# Patient Record
Sex: Male | Born: 1940 | Race: White | Hispanic: No | Marital: Married | Smoking: Current every day smoker
Health system: Southern US, Community
[De-identification: ages and names within clinical notes are randomized; demographics above are authoritative.]

## PROBLEM LIST (undated history)

## (undated) DIAGNOSIS — R17 Unspecified jaundice: Secondary | ICD-10-CM

## (undated) DIAGNOSIS — F419 Anxiety disorder, unspecified: Secondary | ICD-10-CM

## (undated) DIAGNOSIS — H269 Unspecified cataract: Secondary | ICD-10-CM

## (undated) DIAGNOSIS — H409 Unspecified glaucoma: Secondary | ICD-10-CM

## (undated) DIAGNOSIS — C250 Malignant neoplasm of head of pancreas: Principal | ICD-10-CM

## (undated) DIAGNOSIS — D490 Neoplasm of unspecified behavior of digestive system: Secondary | ICD-10-CM

## (undated) HISTORY — DX: Unspecified glaucoma: H40.9

## (undated) HISTORY — DX: Malignant neoplasm of head of pancreas: C25.0

## (undated) HISTORY — PX: CATARACT EXTRACTION: SUR2

## (undated) HISTORY — DX: Unspecified cataract: H26.9

## (undated) SURGERY — UPPER ENDOSCOPIC ULTRASOUND (EUS) LINEAR
Anesthesia: Monitor Anesthesia Care

---

## 2015-09-25 ENCOUNTER — Ambulatory Visit (INDEPENDENT_AMBULATORY_CARE_PROVIDER_SITE_OTHER): Payer: Medicare Other | Admitting: Family Medicine

## 2015-09-25 ENCOUNTER — Emergency Department (HOSPITAL_COMMUNITY)
Admission: EM | Admit: 2015-09-25 | Discharge: 2015-09-26 | Disposition: A | Discharge status: Home / Self Care | Payer: Medicare Other | Attending: Emergency Medicine | Admitting: Emergency Medicine

## 2015-09-25 ENCOUNTER — Emergency Department (HOSPITAL_COMMUNITY): Payer: Medicare Other

## 2015-09-25 ENCOUNTER — Encounter (HOSPITAL_COMMUNITY): Payer: Self-pay

## 2015-09-25 VITALS — BP 144/80 | HR 100 | Temp 98.9°F | Resp 16 | Ht 69.0 in | Wt 129.0 lb

## 2015-09-25 DIAGNOSIS — F17210 Nicotine dependence, cigarettes, uncomplicated: Secondary | ICD-10-CM | POA: Insufficient documentation

## 2015-09-25 DIAGNOSIS — R109 Unspecified abdominal pain: Secondary | ICD-10-CM | POA: Diagnosis not present

## 2015-09-25 DIAGNOSIS — R17 Unspecified jaundice: Secondary | ICD-10-CM | POA: Diagnosis not present

## 2015-09-25 DIAGNOSIS — R634 Abnormal weight loss: Secondary | ICD-10-CM | POA: Diagnosis not present

## 2015-09-25 DIAGNOSIS — D490 Neoplasm of unspecified behavior of digestive system: Secondary | ICD-10-CM

## 2015-09-25 DIAGNOSIS — H409 Unspecified glaucoma: Secondary | ICD-10-CM | POA: Insufficient documentation

## 2015-09-25 DIAGNOSIS — D136 Benign neoplasm of pancreas: Secondary | ICD-10-CM | POA: Diagnosis not present

## 2015-09-25 DIAGNOSIS — Z79899 Other long term (current) drug therapy: Secondary | ICD-10-CM | POA: Diagnosis not present

## 2015-09-25 LAB — POCT CBC
Granulocyte percent: 76.1 %G (ref 37–80)
HCT, POC: 36.7 % — AB (ref 43.5–53.7)
HEMOGLOBIN: 12.7 g/dL — AB (ref 14.1–18.1)
LYMPH, POC: 1.7 (ref 0.6–3.4)
MCH, POC: 29.8 pg (ref 27–31.2)
MCHC: 34.6 g/dL (ref 31.8–35.4)
MCV: 86.1 fL (ref 80–97)
MID (cbc): 0.6 (ref 0–0.9)
MPV: 8.8 fL (ref 0–99.8)
POC Granulocyte: 4.7 (ref 2–6.9)
POC LYMPH PERCENT: 17.7 %L (ref 10–50)
POC MID %: 6.2 %M (ref 0–12)
Platelet Count, POC: 157 10*3/uL (ref 142–424)
RBC: 4.26 M/uL — AB (ref 4.69–6.13)
RDW, POC: 17.9 %
WBC: 9.7 10*3/uL (ref 4.6–10.2)

## 2015-09-25 LAB — URINALYSIS, ROUTINE W REFLEX MICROSCOPIC
HGB URINE DIPSTICK: NEGATIVE
Ketones, ur: NEGATIVE mg/dL
Nitrite: POSITIVE — AB
PH: 5.5 (ref 5.0–8.0)
Protein, ur: 30 mg/dL — AB
Specific Gravity, Urine: 1.041 — ABNORMAL HIGH (ref 1.005–1.030)

## 2015-09-25 LAB — COMPLETE METABOLIC PANEL WITH GFR
ALBUMIN: 3.6 g/dL (ref 3.6–5.1)
ALT: 131 U/L — AB (ref 9–46)
AST: 76 U/L — ABNORMAL HIGH (ref 10–35)
Alkaline Phosphatase: 279 U/L — ABNORMAL HIGH (ref 40–115)
BILIRUBIN TOTAL: 14.5 mg/dL — AB (ref 0.2–1.2)
BUN: 16 mg/dL (ref 7–25)
CO2: 26 mmol/L (ref 20–31)
CREATININE: 1.07 mg/dL (ref 0.70–1.18)
Calcium: 9.5 mg/dL (ref 8.6–10.3)
Chloride: 96 mmol/L — ABNORMAL LOW (ref 98–110)
GFR, EST AFRICAN AMERICAN: 78 mL/min (ref >=60)
GFR, Est Non African American: 68 mL/min (ref >=60)
GLUCOSE: 390 mg/dL — AB (ref 65–99)
Potassium: 3.4 mmol/L — ABNORMAL LOW (ref 3.5–5.3)
SODIUM: 131 mmol/L — AB (ref 135–146)
TOTAL PROTEIN: 6 g/dL — AB (ref 6.1–8.1)

## 2015-09-25 LAB — POCT URINALYSIS DIP (MANUAL ENTRY)
Glucose, UA: >=1000 — AB
LEUKOCYTES UA: NEGATIVE
NITRITE UA: NEGATIVE
PH UA: 5.5
RBC UA: NEGATIVE
Spec Grav, UA: 1.01
Urobilinogen, UA: 1

## 2015-09-25 LAB — PROTIME-INR
INR: 1.11 (ref 0.00–1.49)
PROTHROMBIN TIME: 14 s (ref 11.6–15.2)

## 2015-09-25 LAB — TSH: TSH: 2.41 mIU/L (ref 0.40–4.50)

## 2015-09-25 LAB — CBC WITH DIFFERENTIAL/PLATELET
Basophils Absolute: 0 10*3/uL (ref 0.0–0.1)
Basophils Relative: 0 %
EOS PCT: 2 %
Eosinophils Absolute: 0.2 10*3/uL (ref 0.0–0.7)
HEMATOCRIT: 34 % — AB (ref 39.0–52.0)
Hemoglobin: 11.9 g/dL — ABNORMAL LOW (ref 13.0–17.0)
LYMPHS ABS: 1.5 10*3/uL (ref 0.7–4.0)
LYMPHS PCT: 17 %
MCH: 29.2 pg (ref 26.0–34.0)
MCHC: 35 g/dL (ref 30.0–36.0)
MCV: 83.3 fL (ref 78.0–100.0)
MONO ABS: 0.5 10*3/uL (ref 0.1–1.0)
Monocytes Relative: 5 %
Neutro Abs: 6.7 10*3/uL (ref 1.7–7.7)
Neutrophils Relative %: 76 %
PLATELETS: 171 10*3/uL (ref 150–400)
RBC: 4.08 MIL/uL — AB (ref 4.22–5.81)
RDW: 17.1 % — ABNORMAL HIGH (ref 11.5–15.5)
WBC: 8.9 10*3/uL (ref 4.0–10.5)

## 2015-09-25 LAB — URINE MICROSCOPIC-ADD ON

## 2015-09-25 LAB — COMPREHENSIVE METABOLIC PANEL
ALK PHOS: 290 U/L — AB (ref 38–126)
ALT: 137 U/L — AB (ref 17–63)
AST: 86 U/L — ABNORMAL HIGH (ref 15–41)
Albumin: 3.4 g/dL — ABNORMAL LOW (ref 3.5–5.0)
Anion gap: 10 (ref 5–15)
BUN: 17 mg/dL (ref 6–20)
CHLORIDE: 96 mmol/L — AB (ref 101–111)
CO2: 25 mmol/L (ref 22–32)
CREATININE: 0.59 mg/dL — AB (ref 0.61–1.24)
Calcium: 9.5 mg/dL (ref 8.9–10.3)
GFR calc Af Amer: >60 mL/min (ref >60)
GFR calc non Af Amer: >60 mL/min (ref >60)
Glucose, Bld: 269 mg/dL — ABNORMAL HIGH (ref 65–99)
Potassium: 2.9 mmol/L — ABNORMAL LOW (ref 3.5–5.1)
Sodium: 131 mmol/L — ABNORMAL LOW (ref 135–145)
Total Bilirubin: 14.1 mg/dL — ABNORMAL HIGH (ref 0.3–1.2)
Total Protein: 6.8 g/dL (ref 6.5–8.1)

## 2015-09-25 LAB — POC MICROSCOPIC URINALYSIS (UMFC): Mucus: ABSENT

## 2015-09-25 LAB — LIPASE, BLOOD: Lipase: 42 U/L (ref 11–51)

## 2015-09-25 LAB — POCT SEDIMENTATION RATE: POCT SED RATE: 55 mm/hr — AB (ref 0–22)

## 2015-09-25 MED ORDER — POTASSIUM CHLORIDE CRYS ER 20 MEQ PO TBCR
40.0000 meq | EXTENDED_RELEASE_TABLET | Freq: Once | ORAL | Status: AC
  Administered 2015-09-25: 40 meq via ORAL
  Filled 2015-09-25: qty 2

## 2015-09-25 MED ORDER — TRAMADOL HCL 50 MG PO TABS: 50.0000 mg | ORAL_TABLET | Freq: Four times a day (QID) | ORAL | Status: DC | PRN

## 2015-09-25 MED ORDER — IOHEXOL 300 MG/ML  SOLN
25.0000 mL | Freq: Once | INTRAMUSCULAR | Status: AC | PRN
Start: 2015-09-25 — End: 2015-09-25
  Administered 2015-09-25: 25 mL via ORAL

## 2015-09-25 MED ORDER — IOHEXOL 300 MG/ML  SOLN
100.0000 mL | Freq: Once | INTRAMUSCULAR | Status: AC | PRN
Start: 2015-09-25 — End: 2015-09-25
  Administered 2015-09-25: 100 mL via INTRAVENOUS

## 2015-09-25 NOTE — ED Notes (Addendum)
Pt, being sent by Urgent Care, c/o abdominal pain x 1-2 months, weight loss x 3 weeks, jaundice x 2 weeks, and dark urine x 10 days.  Denies pain.  Pt had blood work performed at [facility] and directed to come to the ED for a CT scan.  Denies liver problems and confusion.

## 2015-09-25 NOTE — ED Provider Notes (Signed)
CSN: [id number]     Arrival date & time 09/25/15  1759 History   First MD Initiated Contact with Patient 09/25/15 1910     Chief Complaint  Patient presents with  . Jaundice  . Weight Loss  . Abdominal Pain     (Consider location/radiation/quality/duration/timing/severity/associated sxs/prior Treatment) HPI Roger Vaughn is a 75 y.o. male who comes in for evaluation of abdominal pain, weight loss and jaundice. Patient reports over the past 1.5 months had intermittent abdominal pain that he reports is waxing and waning, sometimes goes days without any discomfort, but sometimes has intense pain that he rates as a 4/10. He reports associated 13 pound weight loss over this time. He does not have a PCP. Went to an urgent care facility today for evaluation and they referred him to the emergency department for a CT scan. He is a 1.5 pack per day smoker for the past 50 years. Denies any fevers, chills, nausea or vomiting, abdominal distention, diarrhea or constipation, urinary symptoms. No other modifying factors or medical complaints.  Past Medical History  Diagnosis Date  . Cataract   . Glaucoma    Past Surgical History  Procedure Laterality Date  . Cataract extraction     Family History  Problem Relation Age of Onset  . Cancer Father   . Cancer Sister   . Cancer Sister    Social History  Substance Use Topics  . Smoking status: Current Every Day Smoker -- 1.50 packs/day for 50 years    Types: Cigarettes  . Smokeless tobacco: Never Used  . Alcohol Use: No    Review of Systems A 10 point review of systems was completed and was negative except for pertinent positives and negatives as mentioned in the history of present illness     Allergies  Review of patient's allergies indicates no known allergies.  Home Medications   Prior to Admission medications   Medication Sig Start Date End Date Taking? Authorizing Provider  ibuprofen (ADVIL,MOTRIN) 200 MG tablet Take 400 mg by mouth  every 6 (six) hours as needed for moderate pain.   Yes Historical Provider, MD  latanoprost (XALATAN) 0.005 % ophthalmic solution 1 drop at bedtime.   Yes Historical Provider, MD  traMADol (ULTRAM) 50 MG tablet Take 1 tablet (50 mg total) by mouth every 6 (six) hours as needed. 09/25/15   Comer Locket, PA-C   BP 158/77 mmHg  Pulse 62  Temp(Src) 97.6 F (36.4 C) (Oral)  Resp 18  SpO2 98% Physical Exam  Constitutional: He is oriented to person, place, and time. He appears well-developed and well-nourished.  HENT:  Head: Normocephalic and atraumatic.  Mouth/Throat: Oropharynx is clear and moist.  Eyes: Conjunctivae are normal. Pupils are equal, round, and reactive to light. Right eye exhibits no discharge. Left eye exhibits no discharge. Scleral icterus is present.  Neck: Neck supple.  Cardiovascular: Normal rate, regular rhythm and normal heart sounds.   Pulmonary/Chest: Effort normal and breath sounds normal. No respiratory distress. He has no wheezes. He has no rales.  Abdominal: Soft. There is no tenderness.  Abdomen is somewhat firm, but not rigid or distended. No tenderness. No other deformities noted.  Musculoskeletal: He exhibits no tenderness.  Neurological: He is alert and oriented to person, place, and time.  Cranial Nerves II-XII grossly intact  Skin: Skin is warm and dry. No rash noted.  jaundiced  Psychiatric: He has a normal mood and affect.  Nursing note and vitals reviewed.   ED Course  Procedures (  including critical care time) Labs Review Labs Reviewed  COMPREHENSIVE METABOLIC PANEL - Abnormal; Notable for the following:    Sodium 131 (*)    Potassium 2.9 (*)    Chloride 96 (*)    Glucose, Bld 269 (*)    Creatinine, Ser 0.59 (*)    Albumin 3.4 (*)    AST 86 (*)    ALT 137 (*)    Alkaline Phosphatase 290 (*)    Total Bilirubin 14.1 (*)    All other components within normal limits  CBC WITH DIFFERENTIAL/PLATELET - Abnormal; Notable for the following:     RBC 4.08 (*)    Hemoglobin 11.9 (*)    HCT 34.0 (*)    RDW 17.1 (*)    All other components within normal limits  URINALYSIS, ROUTINE W REFLEX MICROSCOPIC (NOT AT [facility]) - Abnormal; Notable for the following:    Color, Urine ORANGE (*)    APPearance CLOUDY (*)    Specific Gravity, Urine 1.041 (*)    Glucose, UA >1000 (*)    Bilirubin Urine LARGE (*)    Protein, ur 30 (*)    Nitrite POSITIVE (*)    Leukocytes, UA SMALL (*)    All other components within normal limits  URINE MICROSCOPIC-ADD ON - Abnormal; Notable for the following:    Squamous Epithelial / LPF 0-5 (*)    Bacteria, UA FEW (*)    Casts HYALINE CASTS (*)    All other components within normal limits  LIPASE, BLOOD  PROTIME-INR    Imaging Review Ct Abdomen Pelvis W Contrast  09/25/2015  CLINICAL DATA:  75 year old male with abdominal pain and jaundice patient presenting with weight loss. EXAM: CT ABDOMEN AND PELVIS WITH CONTRAST TECHNIQUE: Multidetector CT imaging of the abdomen and pelvis was performed using the standard protocol following bolus administration of intravenous contrast. CONTRAST:  31mL OMNIPAQUE IOHEXOL 300 MG/ML SOLN, 117mL OMNIPAQUE IOHEXOL 300 MG/ML SOLN COMPARISON:  None. 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 No intra-abdominal free air or free fluid. Multiple small hepatic hypodense lesions measuring up to 9 mm in the right lobe of the liver are not well characterized. There is mild intrahepatic biliary ductal dilatation. There is dilatation of the common hepatic and common bile duct. The common bile duct measures up to 2 cm in diameter. The gallbladder is distended. No calcified gallstone identified. There is a 1.9 x 2.5 cm ill-defined hypoenhancing mass in the head of the pancreas. The 1.2 x 1.4 cm hypodense/hypo enhancing nodular density noted more posterior to the main mass in the uncinate process of pancreas which may represent extension of the pancreatic mass or a satellite lesion.  There is atrophy of the body and tail of the pancreas with dilatation of the main pancreatic duct. The pancreatic mass appears to abut the central SMV with moderate compression of the vessel. The spleen, adrenal glands appear unremarkable. 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 Multiple small left renal hypodense lesions are not well characterized but possibly represent cysts. A 1.8 cm exophytic hypodense lesion from the posterior lateral cortex of the left kidney also likely represents a cyst. Ultrasound may provide better evaluation of the kidneys. There is no hydronephrosis on either side. The visualized ureters and urinary bladder appear unremarkable. The prostate and seminal vesicles are grossly unremarkable. There is extensive sigmoid diverticulosis with muscular hypertrophy. No active inflammatory changes. There is no evidence of bowel obstruction or  inflammation. Normal appendix. Advanced aortoiliac atherosclerotic disease. The aorta is ectatic and measures up to 2.5 cm in diameter. The origins of the celiac axis, SMA, IMA as well as the origins of the renal arteries appear patent. No portal venous gas identified. There is no adenopathy. The abdominal wall soft tissues appear unremarkable. There is osteopenia with degenerative changes of the spine. No acute fracture. 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 Further evaluation with MRI without and with contrast or EUS recommended. Diffuse biliary ductal dilatation secondary to obstruction of the central CBD at the head of the pancreas. Multiple small hepatic hypodense lesions, incompletely characterized. MRI without and with contrast is recommended for further characterization. Sigmoid diverticulosis. No evidence of bowel obstruction or inflammation. Electronically Signed   By: Anner Crete M.D.   On: 09/25/2015 22:52   I have personally reviewed and evaluated these images and lab results as part of my medical decision-making.   EKG Interpretation None     Meds given in ED:  Medications  potassium chloride SA (K-DUR,KLOR-CON) CR tablet 40 mEq (40 mEq Oral Given 09/25/15 2217)  iohexol (OMNIPAQUE) 300 MG/ML solution 25 mL (25 mLs Oral Contrast Given 09/25/15 2122)  iohexol (OMNIPAQUE) 300 MG/ML solution 100 mL (100 mLs Intravenous Contrast Given 09/25/15 2226)    New Prescriptions   TRAMADOL (ULTRAM) 50 MG TABLET    Take 1 tablet (50 mg total) by mouth every 6 (six) hours as needed.   Filed Vitals:   09/25/15 1825 09/25/15 1936 09/25/15 2113 09/26/15 0011  BP: 155/106 138/73 161/89 158/77  Pulse: 65 49 54 62  Temp: 98.5 F (36.9 C) 97.6 F (36.4 C)    TempSrc: Oral Oral    Resp: 16 16 16 18   SpO2: 100% 100% 97% 98%    MDM  Roger Vaughn is a 75 y.o. male comes in for evaluation of abdominal discomfort and jaundice over the past 1.5 months. Seen at urgent care earlier today and referred to ED for imaging. On arrival, patient is jaundiced, no abdominal pain and denies any other medical problems or symptoms. Bilirubin 14.1, alkaline phosphatase 290. CT scan of abdomen shows hypoenhancing mass in the head of the pancreas most compatible with malignancy. Diffuse biliary ductal dilatation due to obstruction of central CBD at the head of the pancreas. Discussed ED course as well as results with the patient. Answered all questions patient's had at bedside. At this time, he does not want to be admitted to hospital service. They prefer to go home and follow-up on an outpatient basis. I believe this is a reasonable request. Discussed with oncology, Dr. Alvy Bimler, recommends we contact gastroenterology in order to expedite workup to obtain tissue biopsy. Consult to gastroenterology, Dr. Oletta Lamas, his office will call patient tomorrow in order to establish follow-up. Prior to  patient discharge, I discussed and reviewed this case with Dr.Knapp. Patient verbalizes understanding of discharge instructions and ED course today. Voices no other questions or concerns at this time. Is amenable to discharge.   Final diagnoses:  Jaundice  Pancreatic tumor       Comer Locket, PA-C 09/26/15 [id number]  Dorie Rank, MD 09/26/15 1235

## 2015-09-25 NOTE — Discharge Instructions (Signed)
You were evaluated in the ED today and were found to have a tumor on your pancreas. [facility] will call you tomorrow to schedule an appointment for reevaluation. They will need to obtain a tissue biopsy in order to determine what type of tumor it is. If you have not heard from them in 2 days, call their office to schedule an appointment. You may take your pain medicine as prescribed. Return to ED for any worsening symptoms including fever, vomiting,worsening pain.  Jaundice, Adult Jaundice is a yellowish discoloration of the skin, the whites of the eyes, and mucous membranes. Jaundice can be a sign that the liver or the bile system is not working normally. CAUSES This condition is caused by an increased level of bilirubin in the blood. Bilirubin is a substance that is produced by the normal breakdown of red blood cells. Conditions and activities that can cause an increase in the bilirubin level include:  Viral hepatitis.  Gallstones or other conditions, such as a tumor, that can cause a blockage of bile ducts.  Excessive use of alcohol.  Other liver diseases, such as cirrhosis.  Certain cancers.  Certain infections.  Certain genetic syndromes.  Certain medicines. SYMPTOMS Symptoms of this condition include:  Yellow color to the skin, the whites of the eyes, or mucous membranes.  Dark brown urine.  Stomach pain.  Light or clay-colored stool.  Itchy skin (pruritus). DIAGNOSIS This condition is diagnosed with a medical history, physical exam, and blood tests. You may have additional tests to determine what is causing your bilirubin level to increase. TREATMENT Treatment for jaundice depends on the underlying condition. Treatment may include:  Stopping the use of a certain medicine.  Fluids that are given through an IV tube that is inserted into one of your veins.  Medicines to treat pruritus.  Surgery, if there is blockage of the bile ducts. HOME CARE  INSTRUCTIONS  Drink enough fluid to keep your urine clear or pale yellow.  Do not drink alcohol.  Take medicines only as directed by your health care provider.  Keep all follow-up visits as directed by your health care provider. This is important.  You may use skin lotion to relieve itching. SEEK MEDICAL CARE IF:  You have a fever. SEEK IMMEDIATE MEDICAL CARE IF:  Your symptoms suddenly get worse.  You have symptoms for more than 72 hours.  Your pain gets worse.  You vomit repeatedly.  You become weak or confused.  You develop a severe headache.  You become severely dehydrated. Signs of severe dehydration include:  A very dry mouth.  A rapid, weak pulse.  Rapid breathing.  Blue lips.   This information is not intended to replace advice given to you by your health care provider. Make sure you discuss any questions you have with your health care provider.   Document Released: 08/03/2005 Document Revised: 12/18/2014 Document Reviewed: 07/30/2014 Elsevier Interactive Patient Education [employer].

## 2015-09-25 NOTE — Progress Notes (Signed)
[facility] [address] [place] [address] [phone]  Date:  09/25/2015   Name:  Roger Vaughn   DOB:  [birth year]   MRN:  [id number]  PCP:  Velna Hatchet, MD    History of Present Illness:  Roger Vaughn is a 74 y.o. male smoker who presents to [facility] for cc of abdominal pain, jaundice for 2 weeks.     Wife became concerned over the last 2 weeks when he started having yellowing dry skin.  He acknowledges over 1.5 months of constipation, which has been helped by prune juice.  He has had lower pelvic abdominal pain though none tonight.  Over the last 1.5 months he has had loss of appetite and weight loss of 15lbs, he reports.  He has fatigue.  One episode of emesis that occurred about 1 month ago.  Lately, he has been having some loose stools over the last few weeks.  No blood in the stool or melena.  His stool was grayish white, and has now been brown small stool balls.  He has never had a colonoscopy.  He may have not had any medical care for 25 years.  EtOH: none   Meal consist mainly of consist of chicken, cereal, and sweet tea.    He has an appointment scheduled with Dr. Ardeth Perfect of [facility] in 1 month.  This will be his first visit.  Currently has no PCP.  They insisted that he be seen today, with reports of his jaundice.    There are no active problems to display for this patient.   Past Medical History  Diagnosis Date  . Cataract   . Glaucoma     History reviewed. No pertinent past surgical history.  Social History  Substance Use Topics  . Smoking status: Current Every Day Smoker -- 2.00 packs/day for 50 years    Types: Cigarettes  . Smokeless tobacco: Never Used  . Alcohol Use: No    Family History  Problem Relation Age of Onset  . Cancer Father   . Cancer Sister   . Cancer Sister     No Known Allergies  Medication list has been reviewed and updated.  No current outpatient prescriptions on file prior to visit.   No  current facility-administered medications on file prior to visit.    ROS ROS otherwise unremarkable unless listed above.   Physical Examination: BP 144/80 mmHg  Pulse 100  Temp(Src) 98.9 F (37.2 C) (Oral)  Resp 16  Ht 5\' 9"  (1.753 m)  Wt 129 lb (58.514 kg)  BMI 19.04 kg/m2  SpO2 98% Ideal Body Weight: Weight in (lb) to have BMI = 25: 168.9 Wt Readings from Last 3 Encounters:  09/25/15 129 lb (58.514 kg)    Physical Exam  Constitutional: He is oriented to person, place, and time. He appears well-developed and well-nourished. No distress.  HENT:  Head: Normocephalic and atraumatic.  Eyes: Conjunctivae and EOM are normal. Pupils are equal, round, and reactive to light. Right conjunctiva is not injected. Right conjunctiva has no hemorrhage. Left conjunctiva is not injected. Left conjunctiva has no hemorrhage. Scleral icterus is present.  Cardiovascular: Normal rate and regular rhythm.  Exam reveals no gallop and no friction rub.   No murmur heard. Pulmonary/Chest: Effort normal. No respiratory distress. He has no decreased breath sounds. He has no wheezes. He has no rhonchi.  Abdominal: Soft. Normal appearance and bowel sounds are normal. There is no tenderness. There is no tenderness at  McBurney's point and negative Murphy's sign.  Firm mass-like firmness, appreciated just right of sternum.  No tenderness upon palpation.   Liver border unappreciated.   No capute medusae  Neurological: He is alert and oriented to person, place, and time.  Skin: Skin is warm and dry. He is not diaphoretic.  No spider angiomata found. Skin is jaundiced.  Psychiatric: He has a normal mood and affect. His speech is normal and behavior is normal.    Results for orders placed or performed in visit on 09/25/15  POCT CBC  Result Value Ref Range   WBC 9.7 4.6 - 10.2 K/uL   Lymph, poc 1.7 0.6 - 3.4   POC LYMPH PERCENT 17.7 10 - 50 %L   MID (cbc) 0.6 0 - 0.9   POC MID % 6.2 0 - 12 %M   POC  Granulocyte 4.7 2 - 6.9   Granulocyte percent 76.1 37 - 80 %G   RBC 4.26 (A) 4.69 - 6.13 M/uL   Hemoglobin 12.7 (A) 14.1 - 18.1 g/dL   HCT, POC 36.7 (A) 43.5 - 53.7 %   MCV 86.1 80 - 97 fL   MCH, POC 29.8 27 - 31.2 pg   MCHC 34.6 31.8 - 35.4 g/dL   RDW, POC 17.9 %   Platelet Count, POC 157 142 - 424 K/uL   MPV 8.8 0 - 99.8 fL  POCT urinalysis dipstick  Result Value Ref Range   Color, UA brown (A) yellow   Clarity, UA clear clear   Glucose, UA >=1,000 (A) negative   Bilirubin, UA large (A) negative   Ketones, POC UA trace (5) (A) negative   Spec Grav, UA 1.010    Blood, UA negative negative   pH, UA 5.5    Protein Ur, POC =30 (A) negative   Urobilinogen, UA 1.0    Nitrite, UA Negative Negative   Leukocytes, UA Negative Negative  POCT Microscopic Urinalysis ([facility])  Result Value Ref Range   WBC,UR,HPF,POC None None WBC/hpf   RBC,UR,HPF,POC Few (A) None RBC/hpf   Bacteria Few (A) None, Too numerous to count   Mucus Absent Absent   Epithelial Cells, UR Per Microscopy Few (A) None, Too numerous to count cells/hpf     Assessment and Plan: Roger Vaughn is a 75 y.o. male who is here today for abdominal pain, weight loss of about 15lbs, and jaundice. -I have following labs, and he will need imaging today.  i have advised that he be seen at the [facility], for this immediate imaging, as well as prompt lab work.  PT and INR were not performed with the first draw, and this can be done more aptly at the ED.   -contacted charge nurse of his arrival.    Jaundice - Plan: POCT CBC, POCT urinalysis dipstick, POCT Microscopic Urinalysis ([facility]), POCT SEDIMENTATION RATE, COMPLETE METABOLIC PANEL WITH GFR, Q000111Q AND PTT  Loss of weight - Plan: POCT CBC, POCT urinalysis dipstick, POCT Microscopic Urinalysis ([facility]), POCT SEDIMENTATION RATE, COMPLETE METABOLIC PANEL WITH GFR, [id number] AND PTT  Abdominal pain, unspecified abdominal location - Plan: POCT CBC, POCT urinalysis  dipstick, POCT Microscopic Urinalysis ([facility]), POCT SEDIMENTATION RATE, COMPLETE METABOLIC PANEL WITH GFR, [id number] AND PTT  Ivar Drape, PA-C [facility] 2/8/20175:11 PM

## 2015-09-25 NOTE — Patient Instructions (Signed)
You are going to head to [facility].  I am alerting the Charge Nurse of your arrival so go straight there.   We will go from there .

## 2015-09-27 ENCOUNTER — Encounter (HOSPITAL_COMMUNITY): Payer: Self-pay | Admitting: *Deleted

## 2015-09-27 ENCOUNTER — Other Ambulatory Visit: Payer: Self-pay | Admitting: Gastroenterology

## 2015-09-28 NOTE — Addendum Note (Signed)
Addended by: Arta Silence on: 09/28/2015 02:36 PM   Modules accepted: Orders

## 2015-10-01 ENCOUNTER — Encounter (HOSPITAL_COMMUNITY): Payer: Self-pay

## 2015-10-01 ENCOUNTER — Emergency Department (HOSPITAL_COMMUNITY)
Admission: EM | Admit: 2015-10-01 | Discharge: 2015-10-01 | Discharge status: Against Medical Advice | Payer: Medicare Other | Source: Home / Self Care | Admitting: Gastroenterology

## 2015-10-01 ENCOUNTER — Other Ambulatory Visit: Payer: Self-pay | Admitting: Gastroenterology

## 2015-10-01 DIAGNOSIS — E43 Unspecified severe protein-calorie malnutrition: Secondary | ICD-10-CM | POA: Diagnosis present

## 2015-10-01 DIAGNOSIS — C250 Malignant neoplasm of head of pancreas: Secondary | ICD-10-CM | POA: Diagnosis not present

## 2015-10-01 DIAGNOSIS — F17210 Nicotine dependence, cigarettes, uncomplicated: Secondary | ICD-10-CM | POA: Insufficient documentation

## 2015-10-01 DIAGNOSIS — K831 Obstruction of bile duct: Secondary | ICD-10-CM | POA: Diagnosis present

## 2015-10-01 DIAGNOSIS — F419 Anxiety disorder, unspecified: Secondary | ICD-10-CM | POA: Diagnosis present

## 2015-10-01 DIAGNOSIS — Z681 Body mass index (BMI) 19 or less, adult: Secondary | ICD-10-CM

## 2015-10-01 DIAGNOSIS — Z79899 Other long term (current) drug therapy: Secondary | ICD-10-CM

## 2015-10-01 DIAGNOSIS — Z01818 Encounter for other preprocedural examination: Secondary | ICD-10-CM

## 2015-10-01 DIAGNOSIS — H409 Unspecified glaucoma: Secondary | ICD-10-CM | POA: Diagnosis present

## 2015-10-01 DIAGNOSIS — E871 Hypo-osmolality and hyponatremia: Secondary | ICD-10-CM | POA: Diagnosis present

## 2015-10-01 DIAGNOSIS — R17 Unspecified jaundice: Secondary | ICD-10-CM | POA: Diagnosis not present

## 2015-10-01 DIAGNOSIS — Z9842 Cataract extraction status, left eye: Secondary | ICD-10-CM

## 2015-10-01 DIAGNOSIS — Z809 Family history of malignant neoplasm, unspecified: Secondary | ICD-10-CM

## 2015-10-01 DIAGNOSIS — Z9841 Cataract extraction status, right eye: Secondary | ICD-10-CM

## 2015-10-01 NOTE — ED Notes (Signed)
Pt was called for room assignment, no response

## 2015-10-01 NOTE — Addendum Note (Signed)
Addended by: Arta Silence on: 10/01/2015 03:12 PM   Modules accepted: Orders

## 2015-10-01 NOTE — ED Notes (Addendum)
Per staff at [facility] [phone]), Outlaw MD would like Pt to be admitted for Observation.  Sts he needs medications prior to procedure tomorrow.

## 2015-10-01 NOTE — ED Notes (Signed)
Attempted to call patient for a vital sign re-check. Spouse stated he was in the car sleeping. Informed he needed to get his vital signs checked.

## 2015-10-01 NOTE — ED Notes (Signed)
Pt sent by [facility] for admission prior to Lower Endoscopic Ultrasound tomorrow.  Family reported to staff that Pt has been very anxious about procedure.

## 2015-10-01 NOTE — ED Notes (Signed)
Attempted to call patient to a room but he didn't answer.

## 2015-10-02 ENCOUNTER — Ambulatory Visit (HOSPITAL_COMMUNITY): Admission: RE | Admit: 2015-10-02 | Payer: Medicare Other | Source: Ambulatory Visit | Admitting: Gastroenterology

## 2015-10-02 ENCOUNTER — Inpatient Hospital Stay (HOSPITAL_BASED_OUTPATIENT_CLINIC_OR_DEPARTMENT_OTHER)
Admission: EM | Admit: 2015-10-02 | Discharge: 2015-10-04 | DRG: 435 | Disposition: A | Discharge status: Home / Self Care | Payer: Medicare Other | Attending: Internal Medicine | Admitting: Internal Medicine

## 2015-10-02 ENCOUNTER — Encounter (HOSPITAL_BASED_OUTPATIENT_CLINIC_OR_DEPARTMENT_OTHER): Payer: Self-pay | Admitting: *Deleted

## 2015-10-02 ENCOUNTER — Encounter (HOSPITAL_COMMUNITY)
Admission: EM | Disposition: A | Discharge status: Home / Self Care | Payer: Self-pay | Source: Home / Self Care | Attending: Internal Medicine

## 2015-10-02 DIAGNOSIS — H409 Unspecified glaucoma: Secondary | ICD-10-CM | POA: Diagnosis present

## 2015-10-02 DIAGNOSIS — F17210 Nicotine dependence, cigarettes, uncomplicated: Secondary | ICD-10-CM | POA: Diagnosis present

## 2015-10-02 DIAGNOSIS — Z9841 Cataract extraction status, right eye: Secondary | ICD-10-CM | POA: Diagnosis not present

## 2015-10-02 DIAGNOSIS — R17 Unspecified jaundice: Secondary | ICD-10-CM

## 2015-10-02 DIAGNOSIS — K838 Other specified diseases of biliary tract: Secondary | ICD-10-CM | POA: Diagnosis not present

## 2015-10-02 DIAGNOSIS — E871 Hypo-osmolality and hyponatremia: Secondary | ICD-10-CM | POA: Diagnosis present

## 2015-10-02 DIAGNOSIS — E43 Unspecified severe protein-calorie malnutrition: Secondary | ICD-10-CM | POA: Diagnosis present

## 2015-10-02 DIAGNOSIS — K8689 Other specified diseases of pancreas: Secondary | ICD-10-CM | POA: Diagnosis present

## 2015-10-02 DIAGNOSIS — Z809 Family history of malignant neoplasm, unspecified: Secondary | ICD-10-CM | POA: Diagnosis not present

## 2015-10-02 DIAGNOSIS — C250 Malignant neoplasm of head of pancreas: Secondary | ICD-10-CM | POA: Diagnosis present

## 2015-10-02 DIAGNOSIS — Z681 Body mass index (BMI) 19 or less, adult: Secondary | ICD-10-CM | POA: Diagnosis not present

## 2015-10-02 DIAGNOSIS — K869 Disease of pancreas, unspecified: Secondary | ICD-10-CM | POA: Diagnosis not present

## 2015-10-02 DIAGNOSIS — R739 Hyperglycemia, unspecified: Secondary | ICD-10-CM | POA: Diagnosis not present

## 2015-10-02 DIAGNOSIS — K831 Obstruction of bile duct: Secondary | ICD-10-CM | POA: Diagnosis present

## 2015-10-02 DIAGNOSIS — Z79899 Other long term (current) drug therapy: Secondary | ICD-10-CM | POA: Diagnosis not present

## 2015-10-02 DIAGNOSIS — F419 Anxiety disorder, unspecified: Secondary | ICD-10-CM | POA: Diagnosis present

## 2015-10-02 DIAGNOSIS — Z9842 Cataract extraction status, left eye: Secondary | ICD-10-CM | POA: Diagnosis not present

## 2015-10-02 HISTORY — DX: Unspecified jaundice: R17

## 2015-10-02 HISTORY — DX: Neoplasm of unspecified behavior of digestive system: D49.0

## 2015-10-02 HISTORY — DX: Anxiety disorder, unspecified: F41.9

## 2015-10-02 LAB — CBC WITH DIFFERENTIAL/PLATELET
BASOS PCT: 0 %
Basophils Absolute: 0 10*3/uL (ref 0.0–0.1)
EOS ABS: 0.1 10*3/uL (ref 0.0–0.7)
EOS PCT: 1 %
HCT: 31.8 % — ABNORMAL LOW (ref 39.0–52.0)
HEMOGLOBIN: 11.4 g/dL — AB (ref 13.0–17.0)
LYMPHS ABS: 1.2 10*3/uL (ref 0.7–4.0)
Lymphocytes Relative: 12 %
MCH: 28.5 pg (ref 26.0–34.0)
MCHC: 35.8 g/dL (ref 30.0–36.0)
MCV: 79.5 fL (ref 78.0–100.0)
MONOS PCT: 7 %
Monocytes Absolute: 0.7 10*3/uL (ref 0.1–1.0)
NEUTROS PCT: 80 %
Neutro Abs: 7.6 10*3/uL (ref 1.7–7.7)
PLATELETS: 191 10*3/uL (ref 150–400)
RBC: 4 MIL/uL — ABNORMAL LOW (ref 4.22–5.81)
RDW: 18.9 % — ABNORMAL HIGH (ref 11.5–15.5)
WBC: 9.6 10*3/uL (ref 4.0–10.5)

## 2015-10-02 LAB — URINALYSIS, ROUTINE W REFLEX MICROSCOPIC
Glucose, UA: NEGATIVE mg/dL
Hgb urine dipstick: NEGATIVE
KETONES UR: NEGATIVE mg/dL
NITRITE: POSITIVE — AB
PH: 5.5 (ref 5.0–8.0)
PROTEIN: NEGATIVE mg/dL
Specific Gravity, Urine: 1.018 (ref 1.005–1.030)

## 2015-10-02 LAB — PROTIME-INR
INR: 1.1 (ref 0.00–1.49)
PROTHROMBIN TIME: 14.4 s (ref 11.6–15.2)

## 2015-10-02 LAB — COMPREHENSIVE METABOLIC PANEL
ALBUMIN: 3.1 g/dL — AB (ref 3.5–5.0)
ALT: 136 U/L — ABNORMAL HIGH (ref 17–63)
ANION GAP: 11 (ref 5–15)
AST: 111 U/L — ABNORMAL HIGH (ref 15–41)
Alkaline Phosphatase: 304 U/L — ABNORMAL HIGH (ref 38–126)
BILIRUBIN TOTAL: 19.1 mg/dL — AB (ref 0.3–1.2)
BUN: 17 mg/dL (ref 6–20)
CO2: 25 mmol/L (ref 22–32)
Calcium: 9.2 mg/dL (ref 8.9–10.3)
Chloride: 96 mmol/L — ABNORMAL LOW (ref 101–111)
Creatinine, Ser: 0.47 mg/dL — ABNORMAL LOW (ref 0.61–1.24)
GFR calc Af Amer: >60 mL/min (ref >60)
GFR calc non Af Amer: >60 mL/min (ref >60)
GLUCOSE: 221 mg/dL — AB (ref 65–99)
POTASSIUM: 4 mmol/L (ref 3.5–5.1)
Sodium: 132 mmol/L — ABNORMAL LOW (ref 135–145)
TOTAL PROTEIN: 6.8 g/dL (ref 6.5–8.1)

## 2015-10-02 LAB — URINE MICROSCOPIC-ADD ON: RBC / HPF: NONE SEEN RBC/hpf (ref 0–5)

## 2015-10-02 LAB — GLUCOSE, CAPILLARY: GLUCOSE-CAPILLARY: 218 mg/dL — AB (ref 65–99)

## 2015-10-02 LAB — APTT: APTT: 28 s (ref 24–37)

## 2015-10-02 SURGERY — ESOPHAGEAL ENDOSCOPIC ULTRASOUND (EUS) RADIAL
Anesthesia: General

## 2015-10-02 MED ORDER — DEXTROSE-NACL 5-0.45 % IV SOLN
INTRAVENOUS | Status: DC
  Administered 2015-10-02: 22:00:00 via INTRAVENOUS
  Administered 2015-10-03: 1000 mL via INTRAVENOUS
  Administered 2015-10-03: 06:00:00 via INTRAVENOUS

## 2015-10-02 MED ORDER — SODIUM CHLORIDE 0.9% FLUSH: 3.0000 mL | INTRAVENOUS | Status: DC | PRN

## 2015-10-02 MED ORDER — HYDROMORPHONE HCL 1 MG/ML IJ SOLN
1.0000 mg | INTRAMUSCULAR | Status: DC | PRN
  Administered 2015-10-02 – 2015-10-04 (×6): 1 mg via INTRAVENOUS
  Filled 2015-10-02 (×6): qty 1

## 2015-10-02 MED ORDER — INSULIN ASPART 100 UNIT/ML ~~LOC~~ SOLN
0.0000 [IU] | Freq: Three times a day (TID) | SUBCUTANEOUS | Status: DC
  Administered 2015-10-03: 2 [IU] via SUBCUTANEOUS
  Administered 2015-10-04: 1 [IU] via SUBCUTANEOUS
  Administered 2015-10-04: 2 [IU] via SUBCUTANEOUS

## 2015-10-02 MED ORDER — SODIUM CHLORIDE 0.9% FLUSH
3.0000 mL | Freq: Two times a day (BID) | INTRAVENOUS | Status: DC
  Administered 2015-10-03: 3 mL via INTRAVENOUS

## 2015-10-02 MED ORDER — NICOTINE 14 MG/24HR TD PT24
14.0000 mg | MEDICATED_PATCH | Freq: Once | TRANSDERMAL | Status: DC
  Filled 2015-10-02: qty 1

## 2015-10-02 MED ORDER — HYDROMORPHONE HCL 1 MG/ML IJ SOLN: 1.0000 mg | INTRAMUSCULAR | Status: DC | PRN

## 2015-10-02 MED ORDER — NICOTINE 21 MG/24HR TD PT24
21.0000 mg | MEDICATED_PATCH | Freq: Once | TRANSDERMAL | Status: AC
  Administered 2015-10-02: 21 mg via TRANSDERMAL
  Filled 2015-10-02: qty 1

## 2015-10-02 MED ORDER — DEXTROSE 5 % AND 0.9 % NACL IV BOLUS
1000.0000 mL | Freq: Once | INTRAVENOUS | Status: AC
  Administered 2015-10-02: 1000 mL via INTRAVENOUS

## 2015-10-02 MED ORDER — OXYCODONE HCL 5 MG PO TABS
5.0000 mg | ORAL_TABLET | ORAL | Status: DC | PRN
  Administered 2015-10-03: 5 mg via ORAL
  Filled 2015-10-02: qty 1

## 2015-10-02 MED ORDER — SODIUM CHLORIDE 0.9 % IV SOLN: 250.0000 mL | INTRAVENOUS | Status: DC | PRN

## 2015-10-02 MED ORDER — ACETAMINOPHEN 325 MG PO TABS: 650.0000 mg | ORAL_TABLET | Freq: Four times a day (QID) | ORAL | Status: DC | PRN

## 2015-10-02 MED ORDER — ACETAMINOPHEN 650 MG RE SUPP: 650.0000 mg | Freq: Four times a day (QID) | RECTAL | Status: DC | PRN

## 2015-10-02 MED ORDER — ONDANSETRON HCL 4 MG PO TABS: 4.0000 mg | ORAL_TABLET | Freq: Four times a day (QID) | ORAL | Status: DC | PRN

## 2015-10-02 MED ORDER — MORPHINE SULFATE (PF) 2 MG/ML IV SOLN: 2.0000 mg | INTRAVENOUS | Status: DC | PRN

## 2015-10-02 MED ORDER — ONDANSETRON HCL 4 MG/2ML IJ SOLN: 4.0000 mg | Freq: Three times a day (TID) | INTRAMUSCULAR | Status: DC | PRN

## 2015-10-02 MED ORDER — ZOLPIDEM TARTRATE 5 MG PO TABS
5.0000 mg | ORAL_TABLET | Freq: Every evening | ORAL | Status: DC | PRN
  Filled 2015-10-02: qty 1

## 2015-10-02 MED ORDER — ONDANSETRON HCL 4 MG/2ML IJ SOLN
4.0000 mg | Freq: Four times a day (QID) | INTRAMUSCULAR | Status: DC | PRN
  Administered 2015-10-03: 4 mg via INTRAVENOUS
  Filled 2015-10-02: qty 2

## 2015-10-02 MED ORDER — ONDANSETRON HCL 4 MG/2ML IJ SOLN
4.0000 mg | Freq: Four times a day (QID) | INTRAMUSCULAR | Status: DC | PRN
  Administered 2015-10-02: 4 mg via INTRAVENOUS
  Filled 2015-10-02: qty 2

## 2015-10-02 MED ORDER — DEXTROSE-NACL 5-0.45 % IV SOLN
INTRAVENOUS | Status: AC
  Administered 2015-10-02: 17:00:00 via INTRAVENOUS

## 2015-10-02 MED ORDER — LATANOPROST 0.005 % OP SOLN
1.0000 [drp] | Freq: Every day | OPHTHALMIC | Status: DC
Start: 2015-10-02 — End: 2015-10-04
  Administered 2015-10-02 – 2015-10-03 (×2): 1 [drp] via OPHTHALMIC
  Filled 2015-10-02: qty 2.5

## 2015-10-02 NOTE — Progress Notes (Signed)
Patient transferred from [facility]. Alert and oriented x 3. Denied pain. No c/o nausea. Family at bedside. Will continue to monitor.

## 2015-10-02 NOTE — ED Notes (Signed)
Pt is jaundice, reports recent dx of pancreatic tumor and jaundice. Pt states he was told by his pcp to go to [facility] yesterday morning for admission. Pt states he waited in the waiting room for 12 hours until midnight and left. Pt states he called his md this am and they asked him to come here.

## 2015-10-02 NOTE — Progress Notes (Signed)
Progress note  Pt being transferred from [facility] to [facility] for admission per Dr. Paulita Fujita for continued workup for pancreatic mass and profound jaundice. Pt to undergo ERCP adn EUS on 10/03/15. Page Dr Paulita Fujita when pt arrives. If unable to go to [facility] then pt can go to [facility]. Pt AFVSS. Med surge bed requested.  Linna Darner, MD [facility] 10/02/2015, 1:21 PM

## 2015-10-02 NOTE — H&P (Addendum)
[facility]                                                                                    Patient Demographics  Roger Vaughn, is a 75 y.o. male  CSN: [id number]  MRN: [id number]  DOB - 08-13-1941  Admit Date - 10/02/2015  Outpatient Primary MD for the patient is Velna Hatchet, MD   With History of -  Past Medical History  Diagnosis Date  . Cataract     surgery has corrected  . Glaucoma   . Pancreatic tumor   . Jaundice       Past Surgical History  Procedure Laterality Date  . Cataract extraction Bilateral     in for   Chief Complaint  Patient presents with  . Jaundice     HPI  Roger Vaughn  is a 75 y.o. male, with no significant past medical history, presenting today with 2 weeks history of jaundice, acute. The patient has been having weakness with weight loss for the last few weeks and has episodes of epigastric pain for which she takes Mylanta. Patient denies any history of alcoholism or diabetes. Patient was found to have elevated  bilirubin with a pancreatic mass and is for ERCP tomorrow by Dr. Paulita Fujita.    Review of Systems    In addition to the HPI above,  No Fever-chills, No Headache, No changes with Vision or hearing, No problems swallowing food or Liquids, No Chest pain, Cough or Shortness of Breath, No Blood in stool or Urine, No dysuria, No new skin rashes or bruises, No new joints pains-aches,  No new weakness, tingling, numbness in any extremity, No polyuria, polydypsia or polyphagia, No significant Mental Stressors.  A full 10 point Review of Systems was done, except as stated above, all other Review of Systems were negative.   Social History Social History  Substance Use Topics  . Smoking status: Current Every Day Smoker -- 1.50 packs/day for 50 years    Types: Cigarettes  . Smokeless tobacco: Never Used  . Alcohol Use: No     Family History Family History  Problem Relation Age of Onset  . Cancer  Father   . Cancer Sister   . Cancer Sister      Prior to Admission medications   Medication Sig Start Date End Date Taking? Authorizing Provider  latanoprost (XALATAN) 0.005 % ophthalmic solution Place 1 drop into both eyes at bedtime.    Yes Historical Provider, MD  oxyCODONE (OXY IR/ROXICODONE) 5 MG immediate release tablet Take 5 mg by mouth every 6 (six) hours as needed. pain 09/26/15  Yes Historical Provider, MD  potassium chloride 20 MEQ/15ML (10%) SOLN Take 20 mEq by mouth daily. 09/27/15  Yes Historical Provider, MD    No Known Allergies  Physical Exam  Vitals  Blood pressure 143/65, pulse 97, temperature 98.5 F (36.9 C), temperature source Oral, resp. rate 18, height 5' 7.5" (1.715 m), weight 58.514 kg (129 lb), SpO2 97 %.   1. General well-developed, jaundiced and mal-nourished male in no acute distress  2. Normal affect and insight, Not Suicidal or Homicidal, Awake Alert, Oriented X 3.  3. No  F.N deficits, grossly,   4. Ears and Eyes appear Normal, Conjunctivae jaundiced, PERRLA. Moist Oral Mucosa.  5. Supple Neck, No JVD, No cervical lymphadenopathy appriciated, No Carotid Bruits.  6. Symmetrical Chest wall movement, Good air movement bilaterally, CTAB.  7. RRR, No Gallops, Rubs or Murmurs, No Parasternal Heave.  8. Positive Bowel Sounds, Abdomen Soft, mild epigastric tenderness, No organomegaly appriciated,No rebound -guarding or rigidity.  9.  No Cyanosis, Normal Skin Turgor, No Skin Rash or Bruise.  10. Good muscle tone,  joints appear normal , no effusions, Normal ROM.      Data Review  CBC  Recent Labs Lab 09/25/15 1954 10/02/15 1050  WBC 8.9 9.6  HGB 11.9* 11.4*  HCT 34.0* 31.8*  PLT 171 191  MCV 83.3 79.5  MCH 29.2 28.5  MCHC 35.0 35.8  RDW 17.1* 18.9*  LYMPHSABS 1.5 1.2  MONOABS 0.5 0.7  EOSABS 0.2 0.1  BASOSABS 0.0 0.0    ------------------------------------------------------------------------------------------------------------------  Chemistries   Recent Labs Lab 09/25/15 1954 10/02/15 1050  NA 131* 132*  K 2.9* 4.0  CL 96* 96*  CO2 25 25  GLUCOSE 269* 221*  BUN 17 17  CREATININE 0.59* 0.47*  CALCIUM 9.5 9.2  AST 86* 111*  ALT 137* 136*  ALKPHOS 290* 304*  BILITOT 14.1* 19.1*   ------------------------------------------------------------------------------------------------------------------ estimated creatinine clearance is 66 mL/min (by C-G formula based on Cr of 0.47). ------------------------------------------------------------------------------------------------------------------ No results for input(s): TSH, T4TOTAL, T3FREE, THYROIDAB in the last 72 hours.  Invalid input(s): FREET3   Coagulation profile  Recent Labs Lab 09/25/15 2201 10/02/15 1050  INR 1.11 1.10   ------------------------------------------------------------------------------------------------------------------- No results for input(s): DDIMER in the last 72 hours. -------------------------------------------------------------------------------------------------------------------  Cardiac Enzymes No results for input(s): CKMB, TROPONINI, MYOGLOBIN in the last 168 hours.  Invalid input(s): CK ------------------------------------------------------------------------------------------------------------------ Invalid input(s): POCBNP   ---------------------------------------------------------------------------------------------------------------  Urinalysis    Component Value Date/Time   COLORURINE AMBER* 10/02/2015 1838   APPEARANCEUR CLEAR 10/02/2015 1838   LABSPEC 1.018 10/02/2015 1838   PHURINE 5.5 10/02/2015 1838   GLUCOSEU NEGATIVE 10/02/2015 1838   HGBUR NEGATIVE 10/02/2015 1838   BILIRUBINUR LARGE* 10/02/2015 1838   BILIRUBINUR large* 09/25/2015 1712   KETONESUR NEGATIVE 10/02/2015 1838    KETONESUR trace (5)* 09/25/2015 1712   PROTEINUR NEGATIVE 10/02/2015 1838   UROBILINOGEN 1.0 09/25/2015 1712   NITRITE POSITIVE* 10/02/2015 1838   NITRITE Negative 09/25/2015 1712   LEUKOCYTESUR SMALL* 10/02/2015 1838    ----------------------------------------------------------------------------------------------------------------   Imaging results:   Ct Abdomen Pelvis W Contrast  09/25/2015  CLINICAL DATA:  75 year old male with abdominal pain and jaundice patient presenting with weight loss. EXAM: CT ABDOMEN AND PELVIS WITH CONTRAST TECHNIQUE: Multidetector CT imaging of the abdomen and pelvis was performed using the standard protocol following bolus administration of intravenous contrast. CONTRAST:  33mL OMNIPAQUE IOHEXOL 300 MG/ML SOLN, 16mL OMNIPAQUE IOHEXOL 300 MG/ML SOLN COMPARISON:  None. 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 No intra-abdominal free air or free fluid. Multiple small hepatic hypodense lesions measuring up to 9 mm in the right lobe of the liver are not well characterized. There is mild intrahepatic biliary ductal dilatation. There is dilatation of the common hepatic and common bile duct. The common bile duct measures up to 2 cm in diameter. The gallbladder is distended. No calcified gallstone identified. There is a 1.9 x 2.5 cm ill-defined hypoenhancing mass in the head of the pancreas. The 1.2 x 1.4 cm hypodense/hypo enhancing nodular density noted more posterior to the main mass in  the uncinate process of pancreas which may represent extension of the pancreatic mass or a satellite lesion. There is atrophy of the body and tail of the pancreas with dilatation of the main pancreatic duct. The pancreatic mass appears to abut the central SMV with moderate compression of the vessel. The spleen, adrenal glands appear unremarkable. 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 Multiple small left renal  hypodense lesions are not well characterized but possibly represent cysts. A 1.8 cm exophytic hypodense lesion from the posterior lateral cortex of the left kidney also likely represents a cyst. Ultrasound may provide better evaluation of the kidneys. There is no hydronephrosis on either side. The visualized ureters and urinary bladder appear unremarkable. The prostate and seminal vesicles are grossly unremarkable. There is extensive sigmoid diverticulosis with muscular hypertrophy. No active inflammatory changes. There is no evidence of bowel obstruction or inflammation. Normal appendix. Advanced aortoiliac atherosclerotic disease. The aorta is ectatic and measures up to 2.5 cm in diameter. The origins of the celiac axis, SMA, IMA as well as the origins of the renal arteries appear patent. No portal venous gas identified. There is no adenopathy. The abdominal wall soft tissues appear unremarkable. There is osteopenia with degenerative changes of the spine. No acute fracture. 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 Further evaluation with MRI without and with contrast or EUS recommended. Diffuse biliary ductal dilatation secondary to obstruction of the central CBD at the head of the pancreas. Multiple small hepatic hypodense lesions, incompletely characterized. MRI without and with contrast is recommended for further characterization. Sigmoid diverticulosis. No evidence of bowel obstruction or inflammation. Electronically Signed   By: Anner Crete M.D.   On: 09/25/2015 22:52      Assessment & Plan  1. 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 Hyperbilirubinemia secondary to 1 3. Hyperglycemia with no history of diabetes mellitus  Plan  The patient is admitted for ERCP with EUS in AM by Dr. Hosie Poisson Nothing by mouth after midnight   DVT Prophylaxis Heparin  AM Labs Ordered, also  please review Full Orders  Code Status full  Disposition Plan: Home  Time spent in minutes : 32 minutes  Condition fair   @SIGNATURE @

## 2015-10-02 NOTE — Progress Notes (Signed)
[facility] admission notified earlier of patients arrival to unit,they told this Probation officer that they will take care of it.

## 2015-10-02 NOTE — ED Provider Notes (Signed)
CSN: [id number]     Arrival date & time 10/02/15  21 History   First MD Initiated Contact with Patient 10/02/15 1032     Chief Complaint  Patient presents with  . Jaundice     (Consider location/radiation/quality/duration/timing/severity/associated sxs/prior Treatment) HPI Comments: Pt comes in with cc of weakness and jaundice. He is relatively healthy, but recently came down with jaundice and he had a CT abd that showed a pancreatic mass. He is being worked up as an outpatient, and was supposed to get GI procedure today, however, it appears that GI team asked him to come to the ER and get admitted prior to the procedure. I spoke with Dr. Paulita Fujita, who explained that pt is frail and was c/o nausea, severe pain and since it is imperative that we dont delay the procedure and ensure he is medically ready for the procedure he recommended pt get admitted.   ROS 10 Systems reviewed and are negative for acute change except as noted in the HPI.     The history is provided by the patient.    Past Medical History  Diagnosis Date  . Cataract     surgery has corrected  . Glaucoma   . Pancreatic tumor   . Jaundice    Past Surgical History  Procedure Laterality Date  . Cataract extraction Bilateral    Family History  Problem Relation Age of Onset  . Cancer Father   . Cancer Sister   . Cancer Sister    Social History  Substance Use Topics  . Smoking status: Current Every Day Smoker -- 1.50 packs/day for 50 years    Types: Cigarettes  . Smokeless tobacco: Never Used  . Alcohol Use: No    Review of Systems    Allergies  Review of patient's allergies indicates no known allergies.  Home Medications   Prior to Admission medications   Medication Sig Start Date End Date Taking? Authorizing Provider  latanoprost (XALATAN) 0.005 % ophthalmic solution Place 1 drop into both eyes at bedtime.     Historical Provider, MD  oxyCODONE (OXY IR/ROXICODONE) 5 MG immediate release tablet  Take 5 mg by mouth every 6 (six) hours as needed. pain 09/26/15   Historical Provider, MD  potassium chloride 20 MEQ/15ML (10%) SOLN Take 20 mEq by mouth daily. 09/27/15   Historical Provider, MD   BP 115/61 mmHg  Pulse 50  Temp(Src) 98.6 F (37 C) (Oral)  Resp 18  Ht 5' 7.5" (1.715 m)  Wt 129 lb (58.514 kg)  BMI 19.89 kg/m2  SpO2 95% Physical Exam  Constitutional: He is oriented to person, place, and time. He appears well-developed.  HENT:  Head: Atraumatic.  Eyes: Scleral icterus is present.  Neck: Neck supple.  Cardiovascular: Normal rate.   Pulmonary/Chest: Effort normal.  Abdominal: He exhibits distension. There is tenderness. There is no rebound and no guarding.  Neurological: He is alert and oriented to person, place, and time.  Skin: Skin is warm.  Nursing note and vitals reviewed.   ED Course  Procedures (including critical care time) Labs Review Labs Reviewed  CBC WITH DIFFERENTIAL/PLATELET - Abnormal; Notable for the following:    RBC 4.00 (*)    Hemoglobin 11.4 (*)    HCT 31.8 (*)    RDW 18.9 (*)    All other components within normal limits  COMPREHENSIVE METABOLIC PANEL - Abnormal; Notable for the following:    Sodium 132 (*)    Chloride 96 (*)    Glucose, Bld  221 (*)    Creatinine, Ser 0.47 (*)    Albumin 3.1 (*)    AST 111 (*)    ALT 136 (*)    Alkaline Phosphatase 304 (*)    Total Bilirubin 19.1 (*)    All other components within normal limits  PROTIME-INR  APTT  URINALYSIS, ROUTINE W REFLEX MICROSCOPIC (NOT AT [facility])    Imaging Review No results found. I have personally reviewed and evaluated these images and lab results as part of my medical decision-making.   EKG Interpretation None      MDM   Final diagnoses:  Jaundice  Pancreatic mass    Pt with pancreatic mass, likely neoplastic comes in with cc of pain and jaundice. Pt has abd pain and nausea. He needs to get his diagnostic workup done. Seems like he was asked to be admitted  before, and he decided to go home instead, but hasnt had control over the symptoms. Will admit.    Varney Biles, MD 10/02/15 [phone]

## 2015-10-02 NOTE — Progress Notes (Signed)
Very much appreciate hospitalist assistance for admission.  Plan for EUS/ERCP for obstructive pancreatic head mass, tomorrow 10/03/15 at 1:00 pm.

## 2015-10-03 ENCOUNTER — Encounter (HOSPITAL_COMMUNITY)
Admission: EM | Disposition: A | Discharge status: Home / Self Care | Payer: Self-pay | Source: Home / Self Care | Attending: Internal Medicine

## 2015-10-03 ENCOUNTER — Encounter (HOSPITAL_COMMUNITY): Payer: Self-pay | Admitting: Anesthesiology

## 2015-10-03 ENCOUNTER — Inpatient Hospital Stay (HOSPITAL_COMMUNITY): Payer: Medicare Other | Admitting: Anesthesiology

## 2015-10-03 ENCOUNTER — Inpatient Hospital Stay (HOSPITAL_COMMUNITY): Payer: Medicare Other

## 2015-10-03 DIAGNOSIS — C250 Malignant neoplasm of head of pancreas: Secondary | ICD-10-CM | POA: Insufficient documentation

## 2015-10-03 DIAGNOSIS — K869 Disease of pancreas, unspecified: Secondary | ICD-10-CM

## 2015-10-03 DIAGNOSIS — K838 Other specified diseases of biliary tract: Secondary | ICD-10-CM

## 2015-10-03 HISTORY — PX: EUS: SHX5427

## 2015-10-03 HISTORY — DX: Malignant neoplasm of head of pancreas: C25.0

## 2015-10-03 HISTORY — PX: BILIARY STENT PLACEMENT: SHX5538

## 2015-10-03 HISTORY — PX: FINE NEEDLE ASPIRATION: SHX5430

## 2015-10-03 HISTORY — PX: ERCP: SHX5425

## 2015-10-03 LAB — BASIC METABOLIC PANEL
Anion gap: 9 (ref 5–15)
BUN: 17 mg/dL (ref 6–20)
CALCIUM: 8.6 mg/dL — AB (ref 8.9–10.3)
CO2: 22 mmol/L (ref 22–32)
Chloride: 99 mmol/L — ABNORMAL LOW (ref 101–111)
Creatinine, Ser: 0.52 mg/dL — ABNORMAL LOW (ref 0.61–1.24)
GFR calc Af Amer: >60 mL/min (ref >60)
GLUCOSE: 184 mg/dL — AB (ref 65–99)
Potassium: 4 mmol/L (ref 3.5–5.1)
Sodium: 130 mmol/L — ABNORMAL LOW (ref 135–145)

## 2015-10-03 LAB — PROTIME-INR
INR: 1.27 (ref 0.00–1.49)
Prothrombin Time: 16 seconds — ABNORMAL HIGH (ref 11.6–15.2)

## 2015-10-03 LAB — CBC
HCT: 29 % — ABNORMAL LOW (ref 39.0–52.0)
Hemoglobin: 10.1 g/dL — ABNORMAL LOW (ref 13.0–17.0)
MCH: 29 pg (ref 26.0–34.0)
MCHC: 34.8 g/dL (ref 30.0–36.0)
MCV: 83.3 fL (ref 78.0–100.0)
Platelets: 129 10*3/uL — ABNORMAL LOW (ref 150–400)
RBC: 3.48 MIL/uL — ABNORMAL LOW (ref 4.22–5.81)
RDW: 18.3 % — AB (ref 11.5–15.5)
WBC: 7.4 10*3/uL (ref 4.0–10.5)

## 2015-10-03 LAB — GLUCOSE, CAPILLARY
GLUCOSE-CAPILLARY: 162 mg/dL — AB (ref 65–99)
GLUCOSE-CAPILLARY: 101 mg/dL — AB (ref 65–99)
Glucose-Capillary: 107 mg/dL — ABNORMAL HIGH (ref 65–99)
Glucose-Capillary: 144 mg/dL — ABNORMAL HIGH (ref 65–99)

## 2015-10-03 SURGERY — ESOPHAGEAL ENDOSCOPIC ULTRASOUND (EUS) RADIAL
Anesthesia: General

## 2015-10-03 MED ORDER — PROPOFOL 10 MG/ML IV BOLUS
INTRAVENOUS | Status: DC | PRN
  Administered 2015-10-03: 140 mg via INTRAVENOUS

## 2015-10-03 MED ORDER — PHENYLEPHRINE HCL 10 MG/ML IJ SOLN
INTRAMUSCULAR | Status: DC | PRN
  Administered 2015-10-03 (×3): 40 ug via INTRAVENOUS

## 2015-10-03 MED ORDER — LIDOCAINE HCL (CARDIAC) 20 MG/ML IV SOLN
INTRAVENOUS | Status: AC
  Filled 2015-10-03: qty 5

## 2015-10-03 MED ORDER — GLUCAGON HCL RDNA (DIAGNOSTIC) 1 MG IJ SOLR
INTRAMUSCULAR | Status: AC
  Filled 2015-10-03: qty 1

## 2015-10-03 MED ORDER — SUCCINYLCHOLINE CHLORIDE 20 MG/ML IJ SOLN
INTRAMUSCULAR | Status: DC | PRN
  Administered 2015-10-03: 100 mg via INTRAVENOUS

## 2015-10-03 MED ORDER — FENTANYL CITRATE (PF) 100 MCG/2ML IJ SOLN
INTRAMUSCULAR | Status: DC | PRN
  Administered 2015-10-03 (×2): 50 ug via INTRAVENOUS

## 2015-10-03 MED ORDER — SODIUM CHLORIDE 0.9 % IV SOLN: INTRAVENOUS | Status: DC

## 2015-10-03 MED ORDER — LIDOCAINE HCL (CARDIAC) 20 MG/ML IV SOLN
INTRAVENOUS | Status: DC | PRN
  Administered 2015-10-03: 40 mg via INTRAVENOUS

## 2015-10-03 MED ORDER — ONDANSETRON HCL 4 MG/2ML IJ SOLN
INTRAMUSCULAR | Status: DC | PRN
  Administered 2015-10-03: 4 mg via INTRAVENOUS

## 2015-10-03 MED ORDER — CIPROFLOXACIN IN D5W 400 MG/200ML IV SOLN
INTRAVENOUS | Status: DC | PRN
  Administered 2015-10-03: 400 mg via INTRAVENOUS

## 2015-10-03 MED ORDER — FENTANYL CITRATE (PF) 100 MCG/2ML IJ SOLN
INTRAMUSCULAR | Status: AC
  Filled 2015-10-03: qty 2

## 2015-10-03 MED ORDER — LACTATED RINGERS IV SOLN
INTRAVENOUS | Status: DC | PRN
  Administered 2015-10-03: 13:00:00 via INTRAVENOUS

## 2015-10-03 MED ORDER — NICOTINE 21 MG/24HR TD PT24
21.0000 mg | MEDICATED_PATCH | Freq: Every day | TRANSDERMAL | Status: DC
  Administered 2015-10-03 – 2015-10-04 (×2): 21 mg via TRANSDERMAL
  Filled 2015-10-03 (×2): qty 1

## 2015-10-03 MED ORDER — ALPRAZOLAM 0.25 MG PO TABS
0.2500 mg | ORAL_TABLET | Freq: Two times a day (BID) | ORAL | Status: DC | PRN
  Administered 2015-10-03: 0.25 mg via ORAL
  Filled 2015-10-03: qty 1

## 2015-10-03 MED ORDER — ONDANSETRON HCL 4 MG/2ML IJ SOLN
INTRAMUSCULAR | Status: AC
  Filled 2015-10-03: qty 2

## 2015-10-03 MED ORDER — PROPOFOL 10 MG/ML IV BOLUS
INTRAVENOUS | Status: AC
  Filled 2015-10-03: qty 20

## 2015-10-03 MED ORDER — CIPROFLOXACIN IN D5W 400 MG/200ML IV SOLN
INTRAVENOUS | Status: AC
  Filled 2015-10-03: qty 200

## 2015-10-03 MED ORDER — HEPARIN SODIUM (PORCINE) 5000 UNIT/ML IJ SOLN
5000.0000 [IU] | Freq: Three times a day (TID) | INTRAMUSCULAR | Status: DC
  Administered 2015-10-03 – 2015-10-04 (×2): 5000 [IU] via SUBCUTANEOUS
  Filled 2015-10-03 (×2): qty 1

## 2015-10-03 MED ORDER — GLUCAGON HCL RDNA (DIAGNOSTIC) 1 MG IJ SOLR
INTRAMUSCULAR | Status: DC | PRN
  Administered 2015-10-03: .5 mg via INTRAVENOUS

## 2015-10-03 MED ORDER — GLYCOPYRROLATE 0.2 MG/ML IJ SOLN
INTRAMUSCULAR | Status: DC | PRN
  Administered 2015-10-03: 0.2 mg via INTRAVENOUS

## 2015-10-03 MED ORDER — SODIUM CHLORIDE 0.9 % IV SOLN
INTRAVENOUS | Status: DC | PRN
  Administered 2015-10-03: 40 mL

## 2015-10-03 NOTE — Anesthesia Preprocedure Evaluation (Addendum)
Anesthesia Evaluation  Patient identified by MRN, date of birth, ID band Patient awake    Reviewed: Allergy & Precautions, NPO status , Patient's Chart, lab work & pertinent test results  Airway Mallampati: II  TM Distance: >3 FB Neck ROM: Full    Dental no notable dental hx.    Pulmonary Current Smoker,    Pulmonary exam normal breath sounds clear to auscultation       Cardiovascular negative cardio ROS Normal cardiovascular exam Rhythm:Regular Rate:Normal     Neuro/Psych negative neurological ROS  negative psych ROS   GI/Hepatic negative GI ROS, Neg liver ROS,   Endo/Other  negative endocrine ROS  Renal/GU negative Renal ROS  negative genitourinary   Musculoskeletal negative musculoskeletal ROS (+)   Abdominal   Peds negative pediatric ROS (+)  Hematology negative hematology ROS (+)   Anesthesia Other Findings   Reproductive/Obstetrics negative OB ROS                            Anesthesia Physical Anesthesia Plan  ASA: II  Anesthesia Plan: General   Post-op Pain Management:    Induction: Intravenous  Airway Management Planned: Oral ETT  Additional Equipment:   Intra-op Plan:   Post-operative Plan: Extubation in OR  Informed Consent: I have reviewed the patients History and Physical, chart, labs and discussed the procedure including the risks, benefits and alternatives for the proposed anesthesia with the patient or authorized representative who has indicated his/her understanding and acceptance.   Dental advisory given  Plan Discussed with: CRNA  Anesthesia Plan Comments:         Anesthesia Quick Evaluation  

## 2015-10-03 NOTE — Progress Notes (Signed)
Initial Nutrition Assessment  DOCUMENTATION CODES:   Severe malnutrition in context of chronic illness  INTERVENTION:   -Diet advancement per MD -Once diet is advanced, provide Ensure Enlive po BID, each supplement provides 350 kcal and 20 grams of protein -RD to continue to monitor  NUTRITION DIAGNOSIS:   Malnutrition related to chronic illness as evidenced by energy intake < or equal to 75% for > or equal to 1 month, percent weight loss.  GOAL:   Patient will meet greater than or equal to 90% of their needs  MONITOR:   Diet advancement, Labs, Weight trends, I & O's  REASON FOR ASSESSMENT:   Malnutrition Screening Tool    ASSESSMENT:   75 y.o. male, with no significant past medical history, presenting today with 2 weeks history of jaundice, acute. The patient has been having weakness with weight loss for the last few weeks and has episodes of epigastric pain for which she takes Mylanta. Patient denies any history of alcoholism or diabetes. Patient was found to have elevated bilirubin with a pancreatic mass and is for ERCP tomorrow by Dr. Paulita Fujita.  Patient in room with wife at bedside. Pt reports stomach pain and nausea for the past couple of months (2-3 months) PTA. Along with these symptoms, he has had declining appetite. Pt's wife reports he has been eating 1 meal a day, usually breakfast of shredded wheat. During the day he would snack on some ice cream. Very rarely would he consume another meal. However, he reports feeling really hungry last night where he ate pizza, coleslaw, macaroni and cheese, ice cream and pepsi. He denies any swallowing or chewing issues. Pt currently NPO for ERCP this afternoon. Pt is willing to try Ensure supplements once diet is advanced.  Pt reports UBW of 150, where as his wife states 145 lb. This represents a 10-14% weight loss x 2-3 months, both which are significant for time frame.  Unable to perform NFPE at this time. RN and tech were checking  blood sugars and his bed alarm kept going off. Will attempt at follow-up. Expect to find some depletion based on visual assessment.  Medications: D5N infusion @ 75 ml/hr ->provides 306 kcal   Labs reviewed: CBGs: 101-218 Low Na, Creatinine  Diet Order:  Diet NPO time specified  Skin:  Reviewed, no issues  Last BM:  2/15  Height:   Ht Readings from Last 1 Encounters:  10/02/15 5' 7.5" (1.715 m)    Weight:   Wt Readings from Last 1 Encounters:  10/02/15 129 lb (58.514 kg)    Ideal Body Weight:  70 kg  BMI:  Body mass index is 19.89 kg/(m^2).  Estimated Nutritional Needs:   Kcal:  1650-1850  Protein:  80-90g  Fluid:  1.9L/day  EDUCATION NEEDS:   No education needs identified at this time  Roger Bibles, MS, RD, LDN Pager: [phone] After Hours Pager: [phone]

## 2015-10-03 NOTE — Transfer of Care (Signed)
Immediate Anesthesia Transfer of Care Note  Patient: Roger Vaughn  Procedure(s) Performed: Procedure(s): ESOPHAGEAL ENDOSCOPIC ULTRASOUND (EUS) RADIAL (N/A) FINE NEEDLE ASPIRATION (FNA) LINEAR (N/A) ENDOSCOPIC RETROGRADE CHOLANGIOPANCREATOGRAPHY (ERCP) (N/A) BILIARY STENT PLACEMENT (N/A)  Patient Location: PACU  Anesthesia Type:General  Level of Consciousness: sedated, patient cooperative and responds to stimulation  Airway & Oxygen Therapy: Patient Spontanous Breathing and Patient connected to face mask oxygen  Post-op Assessment: Report given to RN and Post -op Vital signs reviewed and stable  Post vital signs: Reviewed and stable  Last Vitals:  Filed Vitals:   10/03/15 1236 10/03/15 1505  BP: 129/62 109/69  Pulse: 51   Temp: 36.9 C 36.6 C  Resp: 11     Complications: No apparent anesthesia complications

## 2015-10-03 NOTE — Interval H&P Note (Signed)
History and Physical Interval Note:  10/03/2015 1:00 PM  Steve Krenz  has presented today for surgery, with the diagnosis of pancreatic mass  The various methods of treatment have been discussed with the patient and family. After consideration of risks, benefits and other options for treatment, the patient has consented to  Procedure(s): ESOPHAGEAL ENDOSCOPIC ULTRASOUND (EUS) RADIAL (N/A) FINE NEEDLE ASPIRATION (FNA) LINEAR (N/A) ENDOSCOPIC RETROGRADE CHOLANGIOPANCREATOGRAPHY (ERCP) (N/A) BILIARY STENT PLACEMENT (N/A) as a surgical intervention .  The patient's history has been reviewed, patient examined, no change in status, stable for surgery.  I have reviewed the patient's chart and labs.  Questions were answered to the patient's satisfaction.     Tanika Bracco,Jancarlo M   Assessment:  1.  Obstructive jaundice. 2.  Pancreatic head mass.  Plan:  1.  Endoscopic ultrasound with anticipated fine needle aspiration biopsies. 2.  Risks (bleeding, infection, bowel perforation that could require surgery, sedation-related changes in cardiopulmonary systems), benefits (identification and possible treatment of source of symptoms, exclusion of certain causes of symptoms), and alternatives (watchful waiting, radiographic imaging studies, empiric medical treatment) of upper endoscopy with ultrasound and fine needle aspiration (EUS +/- FNA) were explained to patient/family in detail and patient wishes to proceed. 3.  ERCP with anticipated biliary stent placement. 4.  Risks (up to and including bleeding, infection, perforation, pancreatitis that can be complicated by infected necrosis and death), benefits (removal of stones, alleviating blockage, decreasing risk of cholangitis or choledocholithiasis-related pancreatitis), and alternatives (watchful waiting, percutaneous transhepatic cholangiography) of ERCP were explained to patient/family in detail and patient elects to proceed.

## 2015-10-03 NOTE — Op Note (Signed)
[place], [id number]   ENDOSCOPIC ULTRASOUND PROCEDURE REPORT  PATIENT: Roger Vaughn, Roger Vaughn  MR#: [id number] BIRTHDATE: 07-06-1941  GENDER: male ENDOSCOPIST: Arta Silence, MD REFERRED BY:  [facility] PROCEDURE DATE:  10/03/2015 PROCEDURE:   Upper EUS w/FNA ASA CLASS:      Class II INDICATIONS:   1.  pancreatic mass, obstructive jaundice. MEDICATIONS: General endotracheal anesthesia  DESCRIPTION OF PROCEDURE:   After the risks benefits and alternatives of the procedure were  explained, informed consent was obtained. The patient was then placed in the left, lateral, decubitus postion and IV sedation was administered. Throughout the procedure, the patients blood pressure, pulse and oxygen saturations were monitored continuously.  Under direct visualization, the Pentax EUS Linear [id number]  endoscope was introduced through the mouth  and advanced to the second portion of the duodenum .  Water was used as necessary to provide an acoustic interface. Estimated blood loss is zero unless otherwise noted in this procedure report. Upon completion of the imaging, water was removed and the patient was sent to the recovery room in satisfactory condition. (No images obtained due to computer malfunction)    FINDINGS:      Hypoechoic 34mm x 56mm round hypoechoic mass seen in head of pancreas with upstream pancreatic and biliary ductal dilatation.  Bile duct 81mm in parts.  Sludge seen in bile duct. Mass abuts portal vein at the confluence and invades the SMV for several centimeters.  No obvious involvement of celiac artery or SMA.  No obvious peripancreatic adenopathy noted.  Mass was biopsied x 4 with 25g FNA needle.  IMPRESSION:     Pancreatic head mass.  If this is an adenocarcinoma, it would be staged T3 N0 Mx by EUS.  RECOMMENDATIONS:     1.  Watch for potential complications of procedure. 2.  Await final cytology results. 3.  ERCP  today for biliary decompression. 4.  Based on patient's age, comorbidities and SMV involvement, I do not think he is likely to be an operative candidate.  As a result, would refer to oncology for palliative treatment considerations.    _______________________________ Lorrin MaisArta Silence, MD 10/03/2015 3:11 PM   CC:

## 2015-10-03 NOTE — Progress Notes (Signed)
Patient Demographics  Kabel Eyles, is a 75 y.o. male, DOB - 1941/01/30, [id number]  Admit date - 10/02/2015   Admitting Physician Bonnielee Haff, MD  Outpatient Primary MD for the patient is Velna Hatchet, MD  LOS - 1   Chief Complaint  Patient presents with  . Jaundice       Admission HPI/Brief narrative: 75 y.o. male, sent by Dr. Paulita Fujita office or evaluation of a pancreatic mass causing obstructive jaundice , patient endorses weight loss over last 2 month , workup significant for elevated bilirubin , admitted for ERCP /EUS .  Subjective:   Marcial Pacas today has, No headache, No chest pain,  No Nausea, No Cough - SOB,  complaints of abdominal pain   Assessment & Plan    Active Problems:   Pancreatic mass   pancreatic mass - Patient with CT abdomen pelvis as an outpatient showing evidence of pancreatic mass, admitted for further evaluation, patient went for ERCP today, status post a stent placement and decompression, as well EUS with biopsy. - Will be started on clear liquid diet, hopefully discharge in a.m as per GI note  Obstructive jaundice - Secondary to pancreatic mass compressing on bile duct, status post decompression and stent placement,  will recheck labs in a.m.   Code Status: Full  Family Communication: None at bedside  Disposition Plan: Home   Procedures  ERCP and EUS 2/16 by Dr. Paulita Fujita   Consults  GI   Medications  Scheduled Meds: . [MAR Hold] heparin  5,000 Units Subcutaneous 3 times per day  . [MAR Hold] insulin aspart  0-9 Units Subcutaneous TID WC  . [MAR Hold] latanoprost  1 drop Both Eyes QHS  . [MAR Hold] sodium chloride flush  3 mL Intravenous Q12H   Continuous Infusions: . sodium chloride    . sodium chloride    . sodium chloride    . dextrose 5 % and 0.45% NaCl 75 mL/hr at 10/03/15 0601   PRN Meds:.[MAR Hold] sodium chloride, [MAR  Hold] acetaminophen **OR** [MAR Hold] acetaminophen, [MAR Hold] ALPRAZolam, [MAR Hold]  HYDROmorphone (DILAUDID) injection, [MAR Hold]  morphine injection, [MAR Hold] ondansetron **OR** [MAR Hold] ondansetron (ZOFRAN) IV, [MAR Hold] oxyCODONE, [MAR Hold] sodium chloride flush, [MAR Hold] zolpidem  DVT Prophylaxis  Heparin  Lab Results  Component Value Date   PLT 129* 10/03/2015    Antibiotics   Anti-infectives    None          Objective:   Filed Vitals:   10/02/15 2202 10/03/15 0626 10/03/15 1236 10/03/15 1505  BP:  113/58 129/62   Pulse: 50 49 51   Temp: 98.4 F (36.9 C) 98.5 F (36.9 C) 98.5 F (36.9 C) 97.9 F (36.6 C)  TempSrc: Oral Oral Oral   Resp: 16 16 11    Height:   5' 7.5" (1.715 m)   Weight:   58.514 kg (129 lb)   SpO2:  98% 96%     Wt Readings from Last 3 Encounters:  10/03/15 58.514 kg (129 lb)  09/25/15 58.514 kg (129 lb)     Intake/Output Summary (Last 24 hours) at 10/03/15 1525 Last data filed at 10/03/15 1507  Gross per 24 hour  Intake    750 ml  Output  50 ml  Net    700 ml     Physical Exam  Awake Alert, Oriented X 3, jaundiced [place].AT,PERRAL Supple Neck,No JVD, No cervical lymphadenopathy appriciated.  Symmetrical Chest wall movement, Good air movement bilaterally, CTAB RRR,No Gallops,Rubs or new Murmurs, No Parasternal Heave +ve B.Sounds, Abd Soft, mild epigastric tenderness, No organomegaly appriciated, No rebound - guarding or rigidity. No Cyanosis, Clubbing or edema, No new Rash or bruise   Data Review   Micro Results No results found for this or any previous visit (from the past 240 hour(s)).  Radiology Reports Ct Abdomen Pelvis W Contrast  09/25/2015  CLINICAL DATA:  75 year old male with abdominal pain and jaundice patient presenting with weight loss. EXAM: CT ABDOMEN AND PELVIS WITH CONTRAST TECHNIQUE: Multidetector CT imaging of the abdomen and pelvis was performed using the standard protocol following bolus  administration of intravenous contrast. CONTRAST:  59mL OMNIPAQUE IOHEXOL 300 MG/ML SOLN, 170mL OMNIPAQUE IOHEXOL 300 MG/ML SOLN COMPARISON:  None. 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 No intra-abdominal free air or free fluid. Multiple small hepatic hypodense lesions measuring up to 9 mm in the right lobe of the liver are not well characterized. There is mild intrahepatic biliary ductal dilatation. There is dilatation of the common hepatic and common bile duct. The common bile duct measures up to 2 cm in diameter. The gallbladder is distended. No calcified gallstone identified. There is a 1.9 x 2.5 cm ill-defined hypoenhancing mass in the head of the pancreas. The 1.2 x 1.4 cm hypodense/hypo enhancing nodular density noted more posterior to the main mass in the uncinate process of pancreas which may represent extension of the pancreatic mass or a satellite lesion. There is atrophy of the body and tail of the pancreas with dilatation of the main pancreatic duct. The pancreatic mass appears to abut the central SMV with moderate compression of the vessel. The spleen, adrenal glands appear unremarkable. 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 Multiple small left renal hypodense lesions are not well characterized but possibly represent cysts. A 1.8 cm exophytic hypodense lesion from the posterior lateral cortex of the left kidney also likely represents a cyst. Ultrasound may provide better evaluation of the kidneys. There is no hydronephrosis on either side. The visualized ureters and urinary bladder appear unremarkable. The prostate and seminal vesicles are grossly unremarkable. There is extensive sigmoid diverticulosis with muscular hypertrophy. No active inflammatory changes. There is no evidence of bowel obstruction or inflammation. Normal appendix. Advanced aortoiliac atherosclerotic disease. The aorta is ectatic and measures  up to 2.5 cm in diameter. The origins of the celiac axis, SMA, IMA as well as the origins of the renal arteries appear patent. No portal venous gas identified. There is no adenopathy. The abdominal wall soft tissues appear unremarkable. There is osteopenia with degenerative changes of the spine. No acute fracture. 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 Further evaluation with MRI without and with contrast or EUS recommended. Diffuse biliary ductal dilatation secondary to obstruction of the central CBD at the head of the pancreas. Multiple small hepatic hypodense lesions, incompletely characterized. MRI without and with contrast is recommended for further characterization. Sigmoid diverticulosis. No evidence of bowel obstruction or inflammation. Electronically Signed   By: Anner Crete M.D.   On: 09/25/2015 22:52     CBC  Recent Labs Lab 10/02/15 1050 10/03/15 0353  WBC  9.6 7.4  HGB 11.4* 10.1*  HCT 31.8* 29.0*  PLT 191 129*  MCV 79.5 83.3  MCH 28.5 29.0  MCHC 35.8 34.8  RDW 18.9* 18.3*  LYMPHSABS 1.2  --   MONOABS 0.7  --   EOSABS 0.1  --   BASOSABS 0.0  --     Chemistries   Recent Labs Lab 10/02/15 1050 10/03/15 0353  NA 132* 130*  K 4.0 4.0  CL 96* 99*  CO2 25 22  GLUCOSE 221* 184*  BUN 17 17  CREATININE 0.47* 0.52*  CALCIUM 9.2 8.6*  AST 111*  --   ALT 136*  --   ALKPHOS 304*  --   BILITOT 19.1*  --    ------------------------------------------------------------------------------------------------------------------ estimated creatinine clearance is 66 mL/min (by C-G formula based on Cr of 0.52). ------------------------------------------------------------------------------------------------------------------ No results for input(s): HGBA1C in the last 72  hours. ------------------------------------------------------------------------------------------------------------------ No results for input(s): CHOL, HDL, LDLCALC, TRIG, CHOLHDL, LDLDIRECT in the last 72 hours. ------------------------------------------------------------------------------------------------------------------ No results for input(s): TSH, T4TOTAL, T3FREE, THYROIDAB in the last 72 hours.  Invalid input(s): FREET3 ------------------------------------------------------------------------------------------------------------------ No results for input(s): VITAMINB12, FOLATE, FERRITIN, TIBC, IRON, RETICCTPCT in the last 72 hours.  Coagulation profile  Recent Labs Lab 10/02/15 1050 10/03/15 0353  INR 1.10 1.27    No results for input(s): DDIMER in the last 72 hours.  Cardiac Enzymes No results for input(s): CKMB, TROPONINI, MYOGLOBIN in the last 168 hours.  Invalid input(s): CK ------------------------------------------------------------------------------------------------------------------ Invalid input(s): POCBNP     Time Spent in minutes   30 minutes   Brighten Buzzelli M.D on 10/03/2015 at 3:25 PM  Between 7am to 7pm - Pager - [phone]  After 7pm go to [email or web address] - password [facility]  [facility]   Office  [phone]

## 2015-10-03 NOTE — Op Note (Signed)
[facility] [place], [address]   ERCP PROCEDURE REPORT        EXAM DATE: 10/03/2015  PATIENT NAME:          Roger Vaughn, Roger Vaughn          MR #: [id number] BIRTHDATE:       November 20, 1940     VISIT #:     [phone] ATTENDING:     Arta Silence, MD     STATUS:     inpatient REFERRING MD:       [facility]  INDICATIONS:  The patient is a 74 yr old male here for an ERCP due to obstructive jaundice, pancreatic mass. PROCEDURE PERFORMED:     ERCP with balloon dilation ERCP with sphincterotomy/papillotomy ERCP with stent placement MEDICATIONS:     General endotracheal anesthesia, ciprofloxacin 400 mg IV  CONSENT: The patient understands the risks and benefits of the procedure and understands that these risks include, but are not limited to: sedation, allergic reaction, infection, perforation and/or bleeding. Alternative means of evaluation and treatment include, among others: physical exam, x-rays, and/or surgical intervention. The patient elects to proceed with this endoscopic procedure.  DESCRIPTION OF PROCEDURE: Informed was verified, confirmed and timeout was successfully executed by the treatment team. With the patient in left semi-prone position, medications were administered intravenously.The side-viewing duodenoscope      was passed from the mouth into the esophagus and further advanced from the esophagus into the stomach. From stomach scope was directed to the second portion of the duodenum.  Major papilla was aligned with the duodenoscope. The scope position was confirmed fluoroscopically. Ampulla normal-appearing without any bile flow pre-instrumentation.  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  Above the most proximal stricture, there  was extensive biliary ductal dilatation.  Small access papillotomy was performed and the strictures were dilated with 27mm biliary dilatation balloon.  A 58mm x 33mm fully covered biliary metal wallstent was placed across the strictures with appropriate positioning confirmed endoscopically and fluroscopically.  Copious dark bile through ampulla noted post-procedure.  Pancreatogram was not obtained, intentionally.       ADVERSE EVENT:     None immediate IMPRESSIONS:     As above.  Pancreatic mass with obstructive jaundice.  Biliary stent placement performed.  RECOMMENDATIONS:     1.  Watch for potential complications of procedure. 2.  Follow LFTs. 3.  Clear liquid diet, advance as tolerated. 4.  If no obvious post-procedural complications, patient can hopefully be discharged home tomorrow from GI perspective.  REPEAT EXAM:     None anticipated  ___________________________________ Arta Silence, MD eSigned:  Arta Silence, MD 10/03/2015 3:07 PM cc:

## 2015-10-03 NOTE — Anesthesia Procedure Notes (Signed)
Procedure Name: Intubation Date/Time: 10/03/2015 1:22 PM Performed by: Glory Buff Pre-anesthesia Checklist: Patient identified, Emergency Drugs available, Suction available and Patient being monitored Patient Re-evaluated:Patient Re-evaluated prior to inductionOxygen Delivery Method: Circle System Utilized Preoxygenation: Pre-oxygenation with 100% oxygen Intubation Type: IV induction Ventilation: Mask ventilation without difficulty Laryngoscope Size: Miller and 3 Grade View: Grade I Tube type: Oral Tube size: 7.5 mm Number of attempts: 1 Airway Equipment and Method: Stylet and Oral airway Placement Confirmation: ETT inserted through vocal cords under direct vision,  positive ETCO2 and breath sounds checked- equal and bilateral Secured at: 21 cm Tube secured with: Tape Dental Injury: Teeth and Oropharynx as per pre-operative assessment

## 2015-10-03 NOTE — Anesthesia Postprocedure Evaluation (Signed)
Anesthesia Post Note  Patient: Marcial Pacas  Procedure(s) Performed: Procedure(s) (LRB): ESOPHAGEAL ENDOSCOPIC ULTRASOUND (EUS) RADIAL (N/A) FINE NEEDLE ASPIRATION (FNA) LINEAR (N/A) ENDOSCOPIC RETROGRADE CHOLANGIOPANCREATOGRAPHY (ERCP) (N/A) BILIARY STENT PLACEMENT (N/A)  Patient location during evaluation: PACU Anesthesia Type: General Level of consciousness: awake and alert Pain management: pain level controlled Vital Signs Assessment: post-procedure vital signs reviewed and stable Respiratory status: spontaneous breathing, nonlabored ventilation, respiratory function stable and patient connected to nasal cannula oxygen Cardiovascular status: blood pressure returned to baseline and stable Postop Assessment: no signs of nausea or vomiting Anesthetic complications: no    Last Vitals:  Filed Vitals:   10/03/15 1540 10/03/15 1550  BP: 136/68 166/75  Pulse: 58 59  Temp:    Resp: 21 13    Last Pain:  Filed Vitals:   10/03/15 1552  PainSc: 2                  Cordae Mccarey J

## 2015-10-03 NOTE — H&P (View-Only) (Signed)
[facility] [address] [place] [address] [phone]  Date:  09/25/2015   Name:  Roger Vaughn   DOB:  February 02, 1941   MRN:  [id number]  PCP:  Velna Hatchet, MD    History of Present Illness:  Roger Vaughn is a 75 y.o. male smoker who presents to [facility] for cc of abdominal pain, jaundice for 2 weeks.     Wife became concerned over the last 2 weeks when he started having yellowing dry skin.  He acknowledges over 1.5 months of constipation, which has been helped by prune juice.  He has had lower pelvic abdominal pain though none tonight.  Over the last 1.5 months he has had loss of appetite and weight loss of 15lbs, he reports.  He has fatigue.  One episode of emesis that occurred about 1 month ago.  Lately, he has been having some loose stools over the last few weeks.  No blood in the stool or melena.  His stool was grayish white, and has now been brown small stool balls.  He has never had a colonoscopy.  He may have not had any medical care for 25 years.  EtOH: none   Meal consist mainly of consist of chicken, cereal, and sweet tea.    He has an appointment scheduled with Dr. Ardeth Perfect of [facility] in 1 month.  This will be his first visit.  Currently has no PCP.  They insisted that he be seen today, with reports of his jaundice.    There are no active problems to display for this patient.   Past Medical History  Diagnosis Date  . Cataract   . Glaucoma     History reviewed. No pertinent past surgical history.  Social History  Substance Use Topics  . Smoking status: Current Every Day Smoker -- 2.00 packs/day for 50 years    Types: Cigarettes  . Smokeless tobacco: Never Used  . Alcohol Use: No    Family History  Problem Relation Age of Onset  . Cancer Father   . Cancer Sister   . Cancer Sister     No Known Allergies  Medication list has been reviewed and updated.  No current outpatient prescriptions on file prior to visit.   No  current facility-administered medications on file prior to visit.    ROS ROS otherwise unremarkable unless listed above.   Physical Examination: BP 144/80 mmHg  Pulse 100  Temp(Src) 98.9 F (37.2 C) (Oral)  Resp 16  Ht 5\' 9"  (1.753 m)  Wt 129 lb (58.514 kg)  BMI 19.04 kg/m2  SpO2 98% Ideal Body Weight: Weight in (lb) to have BMI = 25: 168.9 Wt Readings from Last 3 Encounters:  09/25/15 129 lb (58.514 kg)    Physical Exam  Constitutional: He is oriented to person, place, and time. He appears well-developed and well-nourished. No distress.  HENT:  Head: Normocephalic and atraumatic.  Eyes: Conjunctivae and EOM are normal. Pupils are equal, round, and reactive to light. Right conjunctiva is not injected. Right conjunctiva has no hemorrhage. Left conjunctiva is not injected. Left conjunctiva has no hemorrhage. Scleral icterus is present.  Cardiovascular: Normal rate and regular rhythm.  Exam reveals no gallop and no friction rub.   No murmur heard. Pulmonary/Chest: Effort normal. No respiratory distress. He has no decreased breath sounds. He has no wheezes. He has no rhonchi.  Abdominal: Soft. Normal appearance and bowel sounds are normal. There is no tenderness. There is no tenderness at  McBurney's point and negative Murphy's sign.  Firm mass-like firmness, appreciated just right of sternum.  No tenderness upon palpation.   Liver border unappreciated.   No capute medusae  Neurological: He is alert and oriented to person, place, and time.  Skin: Skin is warm and dry. He is not diaphoretic.  No spider angiomata found. Skin is jaundiced.  Psychiatric: He has a normal mood and affect. His speech is normal and behavior is normal.    Results for orders placed or performed in visit on 09/25/15  POCT CBC  Result Value Ref Range   WBC 9.7 4.6 - 10.2 K/uL   Lymph, poc 1.7 0.6 - 3.4   POC LYMPH PERCENT 17.7 10 - 50 %L   MID (cbc) 0.6 0 - 0.9   POC MID % 6.2 0 - 12 %M   POC  Granulocyte 4.7 2 - 6.9   Granulocyte percent 76.1 37 - 80 %G   RBC 4.26 (A) 4.69 - 6.13 M/uL   Hemoglobin 12.7 (A) 14.1 - 18.1 g/dL   HCT, POC 36.7 (A) 43.5 - 53.7 %   MCV 86.1 80 - 97 fL   MCH, POC 29.8 27 - 31.2 pg   MCHC 34.6 31.8 - 35.4 g/dL   RDW, POC 17.9 %   Platelet Count, POC 157 142 - 424 K/uL   MPV 8.8 0 - 99.8 fL  POCT urinalysis dipstick  Result Value Ref Range   Color, UA brown (A) yellow   Clarity, UA clear clear   Glucose, UA >=1,000 (A) negative   Bilirubin, UA large (A) negative   Ketones, POC UA trace (5) (A) negative   Spec Grav, UA 1.010    Blood, UA negative negative   pH, UA 5.5    Protein Ur, POC =30 (A) negative   Urobilinogen, UA 1.0    Nitrite, UA Negative Negative   Leukocytes, UA Negative Negative  POCT Microscopic Urinalysis ([facility])  Result Value Ref Range   WBC,UR,HPF,POC None None WBC/hpf   RBC,UR,HPF,POC Few (A) None RBC/hpf   Bacteria Few (A) None, Too numerous to count   Mucus Absent Absent   Epithelial Cells, UR Per Microscopy Few (A) None, Too numerous to count cells/hpf     Assessment and Plan: Roger Vaughn is a 75 y.o. male who is here today for abdominal pain, weight loss of about 15lbs, and jaundice. -I have following labs, and he will need imaging today.  i have advised that he be seen at the [facility], for this immediate imaging, as well as prompt lab work.  PT and INR were not performed with the first draw, and this can be done more aptly at the ED.   -contacted charge nurse of his arrival.    Jaundice - Plan: POCT CBC, POCT urinalysis dipstick, POCT Microscopic Urinalysis ([facility]), POCT SEDIMENTATION RATE, COMPLETE METABOLIC PANEL WITH GFR, Q000111Q AND PTT  Loss of weight - Plan: POCT CBC, POCT urinalysis dipstick, POCT Microscopic Urinalysis ([facility]), POCT SEDIMENTATION RATE, COMPLETE METABOLIC PANEL WITH GFR, Q000111Q AND PTT  Abdominal pain, unspecified abdominal location - Plan: POCT CBC, POCT urinalysis  dipstick, POCT Microscopic Urinalysis ([facility]), POCT SEDIMENTATION RATE, COMPLETE METABOLIC PANEL WITH GFR, Q000111Q AND PTT  Roger Drape, PA-C [facility] 2/8/20175:11 PM

## 2015-10-04 ENCOUNTER — Encounter (HOSPITAL_COMMUNITY): Payer: Self-pay | Admitting: Gastroenterology

## 2015-10-04 DIAGNOSIS — E871 Hypo-osmolality and hyponatremia: Secondary | ICD-10-CM | POA: Insufficient documentation

## 2015-10-04 DIAGNOSIS — K831 Obstruction of bile duct: Secondary | ICD-10-CM | POA: Insufficient documentation

## 2015-10-04 LAB — COMPREHENSIVE METABOLIC PANEL
ALBUMIN: 2.6 g/dL — AB (ref 3.5–5.0)
ALT: 114 U/L — AB (ref 17–63)
AST: 103 U/L — AB (ref 15–41)
Alkaline Phosphatase: 254 U/L — ABNORMAL HIGH (ref 38–126)
Anion gap: 7 (ref 5–15)
BILIRUBIN TOTAL: 14.4 mg/dL — AB (ref 0.3–1.2)
BUN: 13 mg/dL (ref 6–20)
CO2: 25 mmol/L (ref 22–32)
CREATININE: 0.71 mg/dL (ref 0.61–1.24)
Calcium: 8.6 mg/dL — ABNORMAL LOW (ref 8.9–10.3)
Chloride: 99 mmol/L — ABNORMAL LOW (ref 101–111)
GFR calc Af Amer: >60 mL/min (ref >60)
GFR calc non Af Amer: >60 mL/min (ref >60)
GLUCOSE: 151 mg/dL — AB (ref 65–99)
POTASSIUM: 3.7 mmol/L (ref 3.5–5.1)
Sodium: 131 mmol/L — ABNORMAL LOW (ref 135–145)
TOTAL PROTEIN: 5.6 g/dL — AB (ref 6.5–8.1)

## 2015-10-04 LAB — GLUCOSE, CAPILLARY
Glucose-Capillary: 143 mg/dL — ABNORMAL HIGH (ref 65–99)
Glucose-Capillary: 177 mg/dL — ABNORMAL HIGH (ref 65–99)

## 2015-10-04 MED ORDER — ENSURE ENLIVE PO LIQD
237.0000 mL | Freq: Two times a day (BID) | ORAL | Status: DC
  Administered 2015-10-04: 237 mL via ORAL

## 2015-10-04 MED ORDER — ALPRAZOLAM 0.25 MG PO TABS: 0.2500 mg | ORAL_TABLET | Freq: Every evening | ORAL | Status: AC | PRN

## 2015-10-04 MED ORDER — OXYCODONE HCL 5 MG PO TABS: 5.0000 mg | ORAL_TABLET | Freq: Four times a day (QID) | ORAL | Status: DC | PRN

## 2015-10-04 NOTE — Progress Notes (Signed)
Patient d/c home,stable. 

## 2015-10-04 NOTE — Discharge Instructions (Signed)
Follow with Primary MD Velna Hatchet, MD in 7 days   Get CBC, CMP,checked  by Primary MD next visit.    Activity: As tolerated with Full fall precautions use walker/cane & assistance as needed   Disposition Home    Diet: Heart Healthy , with feeding assistance and aspiration precautions.  For Heart failure patients - Check your Weight same time everyday, if you gain over 2 pounds, or you develop in leg swelling, experience more shortness of breath or chest pain, call your Primary MD immediately. Follow Cardiac Low Salt Diet and 1.5 lit/day fluid restriction.   On your next visit with your primary care physician please Get Medicines reviewed and adjusted.   Please request your Prim.MD to go over all Hospital Tests and Procedure/Radiological results at the follow up, please get all Hospital records sent to your Prim MD by signing hospital release before you go home.   If you experience worsening of your admission symptoms, develop shortness of breath, life threatening emergency, suicidal or homicidal thoughts you must seek medical attention immediately by calling 911 or calling your MD immediately  if symptoms less severe.  You Must read complete instructions/literature along with all the possible adverse reactions/side effects for all the Medicines you take and that have been prescribed to you. Take any new Medicines after you have completely understood and accpet all the possible adverse reactions/side effects.   Do not drive, operating heavy machinery, perform activities at heights, swimming or participation in water activities or provide baby sitting services if your were admitted for syncope or siezures until you have seen by Primary MD or a Neurologist and advised to do so again.  Do not drive when taking Pain medications.    Do not take more than prescribed Pain, Sleep and Anxiety Medications  Special Instructions: If you have smoked or chewed Tobacco  in the last 2 yrs please  stop smoking, stop any regular Alcohol  and or any Recreational drug use.  Wear Seat belts while driving.   Please note  You were cared for by a hospitalist during your hospital stay. If you have any questions about your discharge medications or the care you received while you were in the hospital after you are discharged, you can call the unit and asked to speak with the hospitalist on call if the hospitalist that took care of you is not available. Once you are discharged, your primary care physician will handle any further medical issues. Please note that NO REFILLS for any discharge medications will be authorized once you are discharged, as it is imperative that you return to your primary care physician (or establish a relationship with a primary care physician if you do not have one) for your aftercare needs so that they can reassess your need for medications and monitor your lab values.

## 2015-10-04 NOTE — Progress Notes (Signed)
Stable following yesterday's EUS/ERCP/stent placement.  Final path pending (prelim c/w cancer)  No pain, no vomiting, ate a small amount of breakfast.  Liver chemistries improving.  Patient is lying in bed, alert and coherent, no evident distress.  Impression: Benign post ERCP/EUS course  Recommendation:   1.  Okay for discharge from GI tract standpoint, versus holding patient in hospital for oncology consultation.  2. We will sign off. GI follow-up in the office is not necessary.  Cleotis Nipper, M.D. Pager [phone] If no answer or after 5 PM call [phone]

## 2015-10-04 NOTE — Progress Notes (Signed)
Patient d/c instructions  given to patient, teach back utilized, verbalized understanding. Pain is controlled. Prescriptions given.

## 2015-10-04 NOTE — Discharge Summary (Signed)
Roger Vaughn, is a 75 y.o. male  DOB Jan 09, 1941  MRN [id number].  Admission date:  10/02/2015  Admitting Physician  Bonnielee Haff, MD  Discharge Date:  10/04/2015   Primary MD  Velna Hatchet, MD  Recommendations for primary care physician for things to follow:  - Please check CMP during next visit - Patient to be contacted by Dr. Paulita Fujita regarding final results of biopsy, GI will arrange for oncology follow-up.  Admission Diagnosis  Jaundice [R17] Pancreatic mass [K86.9]   Discharge Diagnosis  Jaundice [R17] Pancreatic mass [K86.9]    Active Problems:   Pancreatic mass      Past Medical History  Diagnosis Date  . Cataract     surgery has corrected  . Glaucoma   . Pancreatic tumor   . Jaundice   . Anxiety     Past Surgical History  Procedure Laterality Date  . Cataract extraction Bilateral   . Eus N/A 10/03/2015    Procedure: ESOPHAGEAL ENDOSCOPIC ULTRASOUND (EUS) RADIAL;  Surgeon: Arta Silence, MD;  Location: [facility];  Service: Endoscopy;  Laterality: N/A;  . Fine needle aspiration N/A 10/03/2015    Procedure: FINE NEEDLE ASPIRATION (FNA) LINEAR;  Surgeon: Arta Silence, MD;  Location: [facility];  Service: Endoscopy;  Laterality: N/A;  . Ercp N/A 10/03/2015    Procedure: ENDOSCOPIC RETROGRADE CHOLANGIOPANCREATOGRAPHY (ERCP);  Surgeon: Arta Silence, MD;  Location: [facility];  Service: Endoscopy;  Laterality: N/A;  . Biliary stent placement N/A 10/03/2015    Procedure: BILIARY STENT PLACEMENT;  Surgeon: Arta Silence, MD;  Location: [facility];  Service: Endoscopy;  Laterality: N/A;       History of present illness and  Hospital Course:     Kindly see H&P for history of present illness and admission details, please review complete Labs, Consult reports and Test reports for all details in brief  HPI  from the history and physical done on the day of admission  10/02/2015 Roger Vaughn is a 75 y.o. male, with no significant past medical history, presenting today with 2 weeks history of jaundice, acute. The patient has been having weakness with weight loss for the last few weeks and has episodes of epigastric pain for which she takes Mylanta. Patient denies any history of alcoholism or diabetes. Patient was found to have elevated bilirubin with a pancreatic mass and is for ERCP tomorrow by Dr. Paulita Fujita.   Hospital Course  75 y.o. male, sent by Dr. Paulita Fujita office or evaluation of a pancreatic mass causing obstructive jaundice , patient endorses weight loss over last 2 month , workup significant for elevated bilirubin , admitted for ERCP /EUS .  pancreatic mass - Patient with CT abdomen pelvis as an outpatient showing evidence of pancreatic mass, admitted for further evaluation, patient went for ERCP 2/16, status post a stent placement and decompression, as well EUS with biopsy. - Noted on clear liquid diet, just regular, tolerated very well. - Discussed with Dr. Cristina Gong, patient can be discharged today, will be contacted by Dr. Paulita Fujita regarding final results  of biopsy, and GI will arrange for oncology follow-up once results are final. - Agent will be given prescription for oxycodone and Xanax on discharge   Obstructive jaundice - Secondary to pancreatic mass compressing on bile duct, status post decompression and stent placement, bilirubin trending down.  Hyponatremia - Mild, asymptomatic  Discharge Condition:  stable   Follow UP  Follow-up Information    Follow up with Velna Hatchet, MD. Schedule an appointment as soon as possible for a visit in 1 week.   Specialty:  Internal Medicine   Contact information:   [place] [address] [phone]       Follow up with Landry Dyke, MD.   Specialty:  Gastroenterology   Why:  you will be contacted by his office.   Contact information:   [address]  [address] [phone]         Discharge Instructions  and  Discharge Medications     Discharge Instructions    Discharge instructions    Complete by:  As directed   Follow with Primary MD Velna Hatchet, MD in 7 days   Get CBC, CMP,checked  by Primary MD next visit.    Activity: As tolerated with Full fall precautions use walker/cane & assistance as needed   Disposition Home    Diet: Heart Healthy , with feeding assistance and aspiration precautions.  For Heart failure patients - Check your Weight same time everyday, if you gain over 2 pounds, or you develop in leg swelling, experience more shortness of breath or chest pain, call your Primary MD immediately. Follow Cardiac Low Salt Diet and 1.5 lit/day fluid restriction.   On your next visit with your primary care physician please Get Medicines reviewed and adjusted.   Please request your Prim.MD to go over all Hospital Tests and Procedure/Radiological results at the follow up, please get all Hospital records sent to your Prim MD by signing hospital release before you go home.   If you experience worsening of your admission symptoms, develop shortness of breath, life threatening emergency, suicidal or homicidal thoughts you must seek medical attention immediately by calling 911 or calling your MD immediately  if symptoms less severe.  You Must read complete instructions/literature along with all the possible adverse reactions/side effects for all the Medicines you take and that have been prescribed to you. Take any new Medicines after you have completely understood and accpet all the possible adverse reactions/side effects.   Do not drive, operating heavy machinery, perform activities at heights, swimming or participation in water activities or provide baby sitting services if your were admitted for syncope or siezures until you have seen by Primary MD or a Neurologist and advised to do so again.  Do not  drive when taking Pain medications.    Do not take more than prescribed Pain, Sleep and Anxiety Medications  Special Instructions: If you have smoked or chewed Tobacco  in the last 2 yrs please stop smoking, stop any regular Alcohol  and or any Recreational drug use.  Wear Seat belts while driving.   Please note  You were cared for by a hospitalist during your hospital stay. If you have any questions about your discharge medications or the care you received while you were in the hospital after you are discharged, you can call the unit and asked to speak with the hospitalist on call if the hospitalist that took care of you is not available. Once you are discharged,  your primary care physician will handle any further medical issues. Please note that NO REFILLS for any discharge medications will be authorized once you are discharged, as it is imperative that you return to your primary care physician (or establish a relationship with a primary care physician if you do not have one) for your aftercare needs so that they can reassess your need for medications and monitor your lab values.     Increase activity slowly    Complete by:  As directed             Medication List    TAKE these medications        ALPRAZolam 0.25 MG tablet  Commonly known as:  XANAX  Take 1 tablet (0.25 mg total) by mouth at bedtime as needed for anxiety.     latanoprost 0.005 % ophthalmic solution  Commonly known as:  XALATAN  Place 1 drop into both eyes at bedtime.     oxyCODONE 5 MG immediate release tablet  Commonly known as:  Oxy IR/ROXICODONE  Take 1 tablet (5 mg total) by mouth every 6 (six) hours as needed. pain     potassium chloride 20 MEQ/15ML (10%) Soln  Take 20 mEq by mouth daily.          Diet and Activity recommendation: See Discharge Instructions above   Consults obtained -  GI   Major procedures and Radiology Reports - PLEASE review detailed and final reports for all details, in  brief -   ERCP and EUS by Dr. Paulita Fujita 2/16   Ct Abdomen Pelvis W Contrast  09/25/2015  CLINICAL DATA:  75 year old male with abdominal pain and jaundice patient presenting with weight loss. EXAM: CT ABDOMEN AND PELVIS WITH CONTRAST TECHNIQUE: Multidetector CT imaging of the abdomen and pelvis was performed using the standard protocol following bolus administration of intravenous contrast. CONTRAST:  73mL OMNIPAQUE IOHEXOL 300 MG/ML SOLN, 153mL OMNIPAQUE IOHEXOL 300 MG/ML SOLN COMPARISON:  None. 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 No intra-abdominal free air or free fluid. Multiple small hepatic hypodense lesions measuring up to 9 mm in the right lobe of the liver are not well characterized. There is mild intrahepatic biliary ductal dilatation. There is dilatation of the common hepatic and common bile duct. The common bile duct measures up to 2 cm in diameter. The gallbladder is distended. No calcified gallstone identified. There is a 1.9 x 2.5 cm ill-defined hypoenhancing mass in the head of the pancreas. The 1.2 x 1.4 cm hypodense/hypo enhancing nodular density noted more posterior to the main mass in the uncinate process of pancreas which may represent extension of the pancreatic mass or a satellite lesion. There is atrophy of the body and tail of the pancreas with dilatation of the main pancreatic duct. The pancreatic mass appears to abut the central SMV with moderate compression of the vessel. The spleen, adrenal glands appear unremarkable. 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 Multiple small left renal hypodense lesions are not well characterized but possibly represent cysts. A 1.8 cm exophytic hypodense lesion from the posterior lateral cortex of the left kidney also likely represents a cyst. Ultrasound may provide better evaluation of the kidneys. There is no hydronephrosis on either side. The visualized ureters  and urinary bladder appear unremarkable. The prostate and seminal vesicles are grossly unremarkable. There is extensive sigmoid diverticulosis with muscular hypertrophy. No active inflammatory changes. There is no evidence of bowel  obstruction or inflammation. Normal appendix. Advanced aortoiliac atherosclerotic disease. The aorta is ectatic and measures up to 2.5 cm in diameter. The origins of the celiac axis, SMA, IMA as well as the origins of the renal arteries appear patent. No portal venous gas identified. There is no adenopathy. The abdominal wall soft tissues appear unremarkable. There is osteopenia with degenerative changes of the spine. No acute fracture. 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 Further evaluation with MRI without and with contrast or EUS recommended. Diffuse biliary ductal dilatation secondary to obstruction of the central CBD at the head of the pancreas. Multiple small hepatic hypodense lesions, incompletely characterized. MRI without and with contrast is recommended for further characterization. Sigmoid diverticulosis. No evidence of bowel obstruction or inflammation. Electronically Signed   By: Anner Crete M.D.   On: 09/25/2015 22:52   Dg Ercp With Sphincterotomy  10/03/2015  CLINICAL DATA:  Obstructive jaundice. Pancreatic lesion on recent CT. EXAM: ERCP TECHNIQUE: Multiple spot images obtained with the fluoroscopic device and submitted for interpretation post-procedure. FLUOROSCOPY TIME:  2 minutes and 36 seconds.  23.4 mGy COMPARISON:  CT 09/25/2015 FINDINGS: Biliary system was cannulated and a wire was advanced into the intrahepatic ducts. Contrast injection confirmed dilatation of the central intrahepatic ducts. Balloon was inflated in the distal common bile duct and a metallic stent was placed in the common bile duct. Mild narrowing of the common bile duct stent.  IMPRESSION: Biliary dilatation.  Placement of metallic biliary stent. These images were submitted for radiologic interpretation only. Please see the procedural report for the amount of contrast and the fluoroscopy time utilized. Electronically Signed   By: Markus Daft M.D.   On: 10/03/2015 16:05    Micro Results     No results found for this or any previous visit (from the past 240 hour(s)).     Today   Subjective:   Roger Vaughn today has no headache,no chest  pain,no new weakness tingling or numbness, feels much better wants to go home today, rating by mouth intake, abdominal pain is minimal and controlled.  Objective:   Blood pressure 128/72, pulse 57, temperature 97.1 F (36.2 C), temperature source Oral, resp. rate 20, height 5' 7.5" (1.715 m), weight 58.514 kg (129 lb), SpO2 99 %.   Intake/Output Summary (Last 24 hours) at 10/04/15 1146 Last data filed at 10/04/15 0957  Gross per 24 hour  Intake   3702 ml  Output      0 ml  Net   3702 ml    Exam Awake Alert, Oriented X 3, jaundiced [place].AT,PERRAL Supple Neck,No JVD, No cervical lymphadenopathy appriciated.  Symmetrical Chest wall movement, Good air movement bilaterally, CTAB RRR,No Gallops,Rubs or new Murmurs, No Parasternal Heave +ve B.Sounds, Abd Soft, mild epigastric tenderness, No organomegaly appriciated, No rebound - guarding or rigidity. No Cyanosis, Clubbing or edema, No new Rash or bruise  Data Review   CBC w Diff: Lab Results  Component Value Date   WBC 7.4 10/03/2015   WBC 9.7 09/25/2015   HGB 10.1* 10/03/2015   HGB 12.7* 09/25/2015   HCT 29.0* 10/03/2015   HCT 36.7* 09/25/2015   PLT 129* 10/03/2015   LYMPHOPCT 12 10/02/2015   MONOPCT 7 10/02/2015   EOSPCT 1 10/02/2015   BASOPCT 0 10/02/2015    CMP: Lab Results  Component Value Date   NA 131* 10/04/2015   K 3.7 10/04/2015   CL 99* 10/04/2015   CO2  25 10/04/2015   BUN 13 10/04/2015   CREATININE 0.71 10/04/2015   CREATININE 1.07  09/25/2015   PROT 5.6* 10/04/2015   ALBUMIN 2.6* 10/04/2015   BILITOT 14.4* 10/04/2015   ALKPHOS 254* 10/04/2015   AST 103* 10/04/2015   ALT 114* 10/04/2015  .   Total Time in preparing paper work, data evaluation and todays exam - 35 minutes  Satrina Magallanes M.D on 10/04/2015 at 11:46 AM  [facility]   Office  [phone]

## 2015-10-08 ENCOUNTER — Telehealth: Payer: Self-pay | Admitting: *Deleted

## 2015-10-08 NOTE — Telephone Encounter (Signed)
Oncology Nurse Navigator Documentation  Oncology Nurse Navigator Flowsheets 10/08/2015  Navigator Location [facility]  Navigator Encounter Type Introductory phone call  Abnormal Finding Date 09/25/2015  Confirmed Diagnosis Date 10/08/2015   Spoke with wife, Roger Vaughn and provided new patient appointment for 10/10/15 at 2:45 with [name], NP and Dr. Benay Spice. Informed of location of [place], valet service, and registration process. Reminded to bring insurance cards and a current medication list, including supplements. Wife verbalizes understanding. HIM notified

## 2015-10-10 ENCOUNTER — Encounter: Payer: Self-pay | Admitting: *Deleted

## 2015-10-10 ENCOUNTER — Ambulatory Visit (HOSPITAL_BASED_OUTPATIENT_CLINIC_OR_DEPARTMENT_OTHER): Payer: Medicare Other

## 2015-10-10 ENCOUNTER — Encounter: Payer: Self-pay | Admitting: Nurse Practitioner

## 2015-10-10 ENCOUNTER — Ambulatory Visit (HOSPITAL_BASED_OUTPATIENT_CLINIC_OR_DEPARTMENT_OTHER): Payer: Medicare Other | Admitting: Nurse Practitioner

## 2015-10-10 ENCOUNTER — Telehealth: Payer: Self-pay | Admitting: Nurse Practitioner

## 2015-10-10 VITALS — BP 133/77 | HR 89 | Temp 98.3°F | Resp 17 | Ht 67.5 in | Wt 119.8 lb

## 2015-10-10 DIAGNOSIS — Z803 Family history of malignant neoplasm of breast: Secondary | ICD-10-CM

## 2015-10-10 DIAGNOSIS — C250 Malignant neoplasm of head of pancreas: Secondary | ICD-10-CM

## 2015-10-10 DIAGNOSIS — K769 Liver disease, unspecified: Secondary | ICD-10-CM

## 2015-10-10 DIAGNOSIS — Z808 Family history of malignant neoplasm of other organs or systems: Secondary | ICD-10-CM

## 2015-10-10 DIAGNOSIS — K831 Obstruction of bile duct: Secondary | ICD-10-CM

## 2015-10-10 DIAGNOSIS — G893 Neoplasm related pain (acute) (chronic): Secondary | ICD-10-CM | POA: Diagnosis not present

## 2015-10-10 DIAGNOSIS — Z720 Tobacco use: Secondary | ICD-10-CM

## 2015-10-10 DIAGNOSIS — R110 Nausea: Secondary | ICD-10-CM

## 2015-10-10 LAB — CBC WITH DIFFERENTIAL/PLATELET
BASO%: 0.7 % (ref 0.0–2.0)
Basophils Absolute: 0.1 10*3/uL (ref 0.0–0.1)
EOS%: 1.3 % (ref 0.0–7.0)
Eosinophils Absolute: 0.1 10*3/uL (ref 0.0–0.5)
HCT: 34 % — ABNORMAL LOW (ref 38.4–49.9)
HGB: 11.3 g/dL — ABNORMAL LOW (ref 13.0–17.1)
LYMPH#: 1.8 10*3/uL (ref 0.9–3.3)
LYMPH%: 16 % (ref 14.0–49.0)
MCH: 29.1 pg (ref 27.2–33.4)
MCHC: 33.1 g/dL (ref 32.0–36.0)
MCV: 87.9 fL (ref 79.3–98.0)
MONO#: 0.8 10*3/uL (ref 0.1–0.9)
MONO%: 7.6 % (ref 0.0–14.0)
NEUT#: 8.2 10*3/uL — ABNORMAL HIGH (ref 1.5–6.5)
NEUT%: 74.4 % (ref 39.0–75.0)
Platelets: 238 10*3/uL (ref 140–400)
RBC: 3.87 10*6/uL — AB (ref 4.20–5.82)
RDW: 16.7 % — ABNORMAL HIGH (ref 11.0–14.6)
WBC: 11 10*3/uL — ABNORMAL HIGH (ref 4.0–10.3)

## 2015-10-10 LAB — COMPREHENSIVE METABOLIC PANEL
ALT: 132 U/L — AB (ref 0–55)
AST: 111 U/L — AB (ref 5–34)
Albumin: 2.9 g/dL — ABNORMAL LOW (ref 3.5–5.0)
Alkaline Phosphatase: 287 U/L — ABNORMAL HIGH (ref 40–150)
Anion Gap: 12 mEq/L — ABNORMAL HIGH (ref 3–11)
BUN: 13.3 mg/dL (ref 7.0–26.0)
CHLORIDE: 97 meq/L — AB (ref 98–109)
CO2: 23 meq/L (ref 22–29)
CREATININE: 0.8 mg/dL (ref 0.7–1.3)
Calcium: 9.1 mg/dL (ref 8.4–10.4)
EGFR: 86 mL/min/{1.73_m2} — ABNORMAL LOW (ref >90)
Glucose: 154 mg/dl — ABNORMAL HIGH (ref 70–140)
POTASSIUM: 3.8 meq/L (ref 3.5–5.1)
SODIUM: 132 meq/L — AB (ref 136–145)
Total Bilirubin: 7.06 mg/dL (ref 0.20–1.20)
Total Protein: 6.5 g/dL (ref 6.4–8.3)

## 2015-10-10 MED ORDER — PROCHLORPERAZINE MALEATE 5 MG PO TABS: 5.0000 mg | ORAL_TABLET | Freq: Four times a day (QID) | ORAL | Status: DC | PRN

## 2015-10-10 MED ORDER — OXYCODONE HCL 5 MG PO TABS: 5.0000 mg | ORAL_TABLET | ORAL | Status: DC | PRN

## 2015-10-10 NOTE — Progress Notes (Signed)
Oncology Nurse Navigator Documentation  Oncology Nurse Navigator Flowsheets 10/10/2015  Navigator Location [facility]  Navigator Encounter Type Initial MedOnc  Abnormal Finding Date -  Confirmed Diagnosis Date -  Patient Visit Type MedOnc;Initial  Treatment Phase Pre-Tx/Tx Discussion  Barriers/Navigation Needs Education;Coordination of Care  Education Understanding Cancer/ Treatment Options;Coping with Diagnosis/ Prognosis;Pain/ Symptom Management;Newly Diagnosed Cancer Education  Interventions Referrals;Education Method  Referrals Social Work;Nutrition/dietician  Education Method Verbal;Written;Teach-back  Support Groups/Services GI Support Group;[place]  Acuity Level 2  Time Spent with Patient 60  Met with patient, wife and friend,Roger Vaughn during new patient visit. Explained the role of the GI Nurse Navigator and provided New Patient Packet with information on: 1. Pancreas cancer--tumor marker CA 19-9 info and general chemotherapy information 2. Support groups 3. Advanced Directives 4. Fall Safety Plan Answered questions, reviewed current treatment plan using TEACH back and provided emotional support. Provided copy of current treatment plan. Roger Vaughn expresses anxiety and some fear of the future. Extensive weight loss and occasional nausea. Provided samples of Boost Glucose Control and Glucerna to try w/coupons.  Merceda Elks, RN, BSN GI Oncology [place]

## 2015-10-10 NOTE — Telephone Encounter (Signed)
per pof to sch pt appt-gave pt copyof avs-sent back to lab-adv pt [facility] willc all to sch MRI

## 2015-10-10 NOTE — Progress Notes (Addendum)
[facility] New Patient Consult   Referring MD: Velna Hatchet, Md [address] [place],  [address]   Roger Vaughn 75 y.o.  [birth year]    Reason for Referral: Pancreas cancer   HPI: Roger Vaughn is a 75 year old man who initially presented on 09/25/2015 for evaluation of abdominal pain and jaundice. The bilirubin was found to be markedly elevated. CT abdomen/pelvis 09/25/2015 showed a hypoenhancing mass in the head/uncinate process of the pancreas; diffuse biliary ductal dilatation secondary to obstruction of the common bile duct at the head of the pancreas; multiple small hepatic hypodense lesions. On 10/03/2015 he underwent an upper EUS with findings of a 20 mm x 25 mm round hypoechoic mass in the head of the pancreas with upstream pancreatic and biliary ductal dilatation. The mass was noted to abut the portal vein at the confluence and invade the SMV for several centimeters. No obvious involvement of the celiac artery or SMA. No obvious peripancreatic adenopathy. Multiple biopsies were obtained. Pathology showed malignant cells consistent with adenocarcinoma. Also on 10/03/2015, he underwent ERCP with stent placement.   Past Medical History  Diagnosis Date  . Cataract     surgery has corrected  . Glaucoma   . Pancreatic tumor   . Jaundice   . Anxiety     Past Surgical History  Procedure Laterality Date  . Cataract extraction Bilateral   . Eus N/A 10/03/2015    Procedure: ESOPHAGEAL ENDOSCOPIC ULTRASOUND (EUS) RADIAL;  Surgeon: Arta Silence, MD;  Location: [facility];  Service: Endoscopy;  Laterality: N/A;  . Fine needle aspiration N/A 10/03/2015    Procedure: FINE NEEDLE ASPIRATION (FNA) LINEAR;  Surgeon: Arta Silence, MD;  Location: [facility];  Service: Endoscopy;  Laterality: N/A;  . Ercp N/A 10/03/2015    Procedure: ENDOSCOPIC RETROGRADE CHOLANGIOPANCREATOGRAPHY (ERCP);  Surgeon: Arta Silence, MD;  Location: [facility];  Service:  Endoscopy;  Laterality: N/A;  . Biliary stent placement N/A 10/03/2015    Procedure: BILIARY STENT PLACEMENT;  Surgeon: Arta Silence, MD;  Location: [facility];  Service: Endoscopy;  Laterality: N/A;    Medications: Reviewed  Allergies: No Known Allergies  Family history: Sister deceased at age 37 with breast cancer. Another sister deceased with throat cancer and a "brain tumor". He thinks his father may have had stomach cancer. He died at age 14. Mother deceased, history of diabetes.  Social History:   He lives in [place]. He is married. He has 3 sons reported to be in good health. He retired in December 2015. 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 He reports tobacco use at 2 packs per day for 55+ years. He is currently smoking one half pack per day. He quit drinking beer 2-2-1/2 years ago. Up until about 6 months ago he was drinking a half shot of whiskey, 2 drinks a day. He discontinued all alcohol 6 months ago. He has never had a blood transfusion.  ROS:   Positives include: Two-month history of abdominal pain. At the time he presented he had a 2 week history of jaundice. For pain he is currently taking oxycodone 5 mg 2 tablets every 6 hours. He notes that he needs more pain medication prior to the time for the next dose. He estimates losing 25 pounds over the past 6 months. Appetite is poor. He has intermittent nausea. No vomiting. He states he is in bed "a lot". He is fatigued. Jaundice is better since the stent was placed. No diarrhea.  Last week he was having frequent bowel movements. Better this week. No bleeding. No fever. He denies shortness of breath. He has occasional chest pain. He notes his urine is lighter.   Physical Exam:  Blood pressure 133/77, pulse 89, temperature 98.3 F (36.8 C), temperature source Oral, resp. rate 17, height 5' 7.5" (1.715 m), weight 119 lb 12.8 oz (54.341 kg), SpO2 100 %. Chronically ill-appearing  man. HEENT: Scleral icterus. Oropharynx without thrush or ulceration. Lungs: Lungs clear bilaterally. Cardiac: Regular rate and rhythm. Abdomen: Abdomen soft, question mildly distended. Nontender. No mass. No organomegaly.  Vascular: No leg edema. Lymph nodes: No palpable cervical, supra clavicular, axillary or inguinal lymph nodes. Neurologic: Alert and oriented. Motor strength 5 over 5. Skin: Jaundice.   LAB:  CBC  Lab Results  Component Value Date   WBC 7.4 10/03/2015   HGB 10.1* 10/03/2015   HCT 29.0* 10/03/2015   MCV 83.3 10/03/2015   PLT 129* 10/03/2015   NEUTROABS 7.6 10/02/2015     CMP      Component Value Date/Time   NA 131* 10/04/2015 0344   K 3.7 10/04/2015 0344   CL 99* 10/04/2015 0344   CO2 25 10/04/2015 0344   GLUCOSE 151* 10/04/2015 0344   BUN 13 10/04/2015 0344   CREATININE 0.71 10/04/2015 0344   CREATININE 1.07 09/25/2015 1652   CALCIUM 8.6* 10/04/2015 0344   PROT 5.6* 10/04/2015 0344   ALBUMIN 2.6* 10/04/2015 0344   AST 103* 10/04/2015 0344   ALT 114* 10/04/2015 0344   ALKPHOS 254* 10/04/2015 0344   BILITOT 14.4* 10/04/2015 0344   GFRNONAA >60 10/04/2015 0344   GFRNONAA 68 09/25/2015 1652   GFRAA >60 10/04/2015 0344   GFRAA 78 09/25/2015 1652     Assessment/Plan:   1. Pancreas cancer presenting with abdominal pain and jaundice  CT abdomen/pelvis 09/25/2015 with a mass in the head/uncinate process of the pancreas; diffuse biliary ductal dilatation secondary to obstruction of the central common bile duct at the head of the pancreas; multiple small hepatic hypodense lesions.  10/03/2015 upper EUS with findings of a 20 mm x 25 mm round hypoechoic mass in the head of the pancreas with upstream pancreatic and biliary ductal dilatation. The mass was noted to abut the portal vein at the confluence and invade the SMV for several centimeters. No obvious involvement of the celiac artery or SMA. No obvious peripancreatic adenopathy. Multiple biopsies  were obtained. Pathology showed malignant cells consistent with adenocarcinoma.   10/03/2015 ERCP with stent placement. 2. Abdominal pain secondary to #1 3. Obstructive jaundice secondary to #1 status post stent placement 10/03/2015 4. Nausea 5. Glaucoma   Disposition:   Roger Vaughn has been diagnosed with pancreas cancer. He understands that surgery is the only potentially curative therapy. He further understands the pancreas mass is likely not resectable and with his current performance status he is not a candidate for surgery. We also discussed that if the liver lesions on CT represent metastatic disease surgery is not an option.  We are referring him for an MRI of the abdomen to further characterize the liver lesions as well as the pancreas mass.  We discussed potential treatment options to include systemic chemotherapy with gemcitabine/Abraxane on a 2 week schedule.  He will return to the lab today for a follow-up chemistry panel.  For pain he will continue oxycodone 5-10 mg as needed. We will increase the frequency to every 4 hours as needed. We will consider a long-acting narcotic at the time of  his next visit if pain continues to be poorly controlled.  For the nausea he will try Compazine mg every 6 hours as needed.  He will return for a follow-up visit on 10/21/2014 to review the MRI results, reevaluate pain and further discuss chemotherapy.    Patient seen with Dr. Benay Spice. 50 minutes were spent face-to-face at today's visit with the majority of that time involved in counseling/coordination of care.  [name], ANP/GNP-BC 10/10/2015, 2:46 PM  This was a shared visit with [name]. Roger Vaughn was interviewed and examined. He has been diagnosed with pancreas cancer. He does not appear to be a surgical candidate based on his age, the CT findings, and vascular invasion on the EUS.  We adjusted the narcotic regimen. He will be referred for a staging MRI. He will return  for an office visit to discuss systemic treatment options after the MRI. I reviewed the CT images.  [name], M.D.

## 2015-10-10 NOTE — Patient Instructions (Signed)
  Care Plan Summary- 10/10/2015  Name: Roger Vaughn      DOB:  1941/02/15 Your Medical Team: Medical Oncologist:  Dr. Ma Rings Radiation Oncologist:     Surgeon:     Type of Cancer:  Adenocarcinoma of Pancreas  Stage/Grade: Stage undetermined *Exact staging of your cancer is based on size of the tumor, depth of invasion, involvement of lymph nodes or not, and whether or not the cancer has spread beyond the primary site    Recommendations: Based on information available as of today's consult. Recommendations may change depending on the results of further tests or exams.  1) Complete staging with MRI to determine if liver is involved 2) Most likely chemotherapy every 2 weeks with Gemzar and Abraxane 3) Increase Oxycodone/apap to 1-2 tabs every 4-6 hours prn-call if not effective  Next Steps: 1) Labs today-office will call results and reorder K+ if needed 2) MRI-radiology will call 3) Return to clinic on 10/21/15 Questions? Merceda Elks, RN, BSN at [phone].Manuela Schwartz is your Oncology Nurse Navigator and is available to assist you while you're receiving your medical care at [facility].

## 2015-10-11 ENCOUNTER — Telehealth: Payer: Self-pay

## 2015-10-11 ENCOUNTER — Telehealth: Payer: Self-pay | Admitting: *Deleted

## 2015-10-11 DIAGNOSIS — C250 Malignant neoplasm of head of pancreas: Secondary | ICD-10-CM

## 2015-10-11 MED ORDER — POTASSIUM CHLORIDE 20 MEQ/15ML (10%) PO SOLN: 20.0000 meq | Freq: Every day | ORAL | Status: AC

## 2015-10-11 NOTE — Telephone Encounter (Signed)
Patient's wife called returning [name], NP's call regarding blood test results.

## 2015-10-11 NOTE — Telephone Encounter (Signed)
Oncology Nurse Navigator Documentation  Oncology Nurse Navigator Flowsheets 10/11/2015  Navigator Location [facility]  Navigator Encounter Type Telephone  Telephone Outgoing Call;Appt Confirmation/Clarification;Diagnostic Results--CBC and CMET results  Abnormal Finding Date -  Confirmed Diagnosis Date -  Patient Visit Type -  Treatment Phase -  Barriers/Navigation Needs Coordination of Care--reviewed MRI appointment with wife; will continue to take daily K+ and wife notified that is was refilled.  Education -  Interventions Coordination of Care--scheduled dietician after OV on 10/21/15  Referrals -  Coordination of Care Appts  Education Method -  Support Groups/Services -  Acuity -  Time Spent with Patient 15

## 2015-10-17 ENCOUNTER — Other Ambulatory Visit: Payer: Self-pay | Admitting: Oncology

## 2015-10-17 DIAGNOSIS — C250 Malignant neoplasm of head of pancreas: Secondary | ICD-10-CM

## 2015-10-17 MED ORDER — OXYCODONE HCL 5 MG PO TABS: 5.0000 mg | ORAL_TABLET | ORAL | Status: DC | PRN

## 2015-10-17 NOTE — Telephone Encounter (Signed)
Pt.'s wife called in today requesting pain medication refill discussed with Dr. Benay Spice, [facility] to refill medication (oxycodone). Pt.'s wife to pick medication up today.

## 2015-10-18 ENCOUNTER — Ambulatory Visit (HOSPITAL_COMMUNITY)
Admission: RE | Admit: 2015-10-18 | Discharge: 2015-10-18 | Disposition: A | Discharge status: Home / Self Care | Payer: Medicare Other | Source: Ambulatory Visit | Attending: Nurse Practitioner | Admitting: Nurse Practitioner

## 2015-10-18 DIAGNOSIS — I7090 Unspecified atherosclerosis: Secondary | ICD-10-CM | POA: Insufficient documentation

## 2015-10-18 DIAGNOSIS — N289 Disorder of kidney and ureter, unspecified: Secondary | ICD-10-CM | POA: Diagnosis not present

## 2015-10-18 DIAGNOSIS — R590 Localized enlarged lymph nodes: Secondary | ICD-10-CM | POA: Insufficient documentation

## 2015-10-18 DIAGNOSIS — C250 Malignant neoplasm of head of pancreas: Secondary | ICD-10-CM | POA: Insufficient documentation

## 2015-10-18 DIAGNOSIS — C787 Secondary malignant neoplasm of liver and intrahepatic bile duct: Secondary | ICD-10-CM | POA: Insufficient documentation

## 2015-10-18 MED ORDER — GADOBENATE DIMEGLUMINE 529 MG/ML IV SOLN
10.0000 mL | Freq: Once | INTRAVENOUS | Status: AC | PRN
  Administered 2015-10-18: 10 mL via INTRAVENOUS

## 2015-10-21 ENCOUNTER — Encounter: Payer: Self-pay | Admitting: *Deleted

## 2015-10-21 ENCOUNTER — Ambulatory Visit (HOSPITAL_BASED_OUTPATIENT_CLINIC_OR_DEPARTMENT_OTHER): Payer: Medicare Other | Admitting: Nurse Practitioner

## 2015-10-21 ENCOUNTER — Ambulatory Visit: Payer: Medicare Other | Admitting: Nutrition

## 2015-10-21 ENCOUNTER — Other Ambulatory Visit (HOSPITAL_BASED_OUTPATIENT_CLINIC_OR_DEPARTMENT_OTHER): Payer: Medicare Other

## 2015-10-21 ENCOUNTER — Telehealth: Payer: Self-pay | Admitting: Oncology

## 2015-10-21 VITALS — BP 130/74 | HR 66 | Temp 97.5°F | Resp 18 | Ht 67.5 in | Wt 115.7 lb

## 2015-10-21 DIAGNOSIS — C787 Secondary malignant neoplasm of liver and intrahepatic bile duct: Secondary | ICD-10-CM

## 2015-10-21 DIAGNOSIS — G893 Neoplasm related pain (acute) (chronic): Secondary | ICD-10-CM

## 2015-10-21 DIAGNOSIS — R634 Abnormal weight loss: Secondary | ICD-10-CM

## 2015-10-21 DIAGNOSIS — C250 Malignant neoplasm of head of pancreas: Secondary | ICD-10-CM | POA: Diagnosis not present

## 2015-10-21 DIAGNOSIS — R110 Nausea: Secondary | ICD-10-CM

## 2015-10-21 DIAGNOSIS — R630 Anorexia: Secondary | ICD-10-CM

## 2015-10-21 LAB — COMPREHENSIVE METABOLIC PANEL
ALK PHOS: 188 U/L — AB (ref 40–150)
ALT: 66 U/L — ABNORMAL HIGH (ref 0–55)
ANION GAP: 11 meq/L (ref 3–11)
AST: 44 U/L — ABNORMAL HIGH (ref 5–34)
Albumin: 3.3 g/dL — ABNORMAL LOW (ref 3.5–5.0)
BILIRUBIN TOTAL: 3.9 mg/dL — AB (ref 0.20–1.20)
BUN: 12.6 mg/dL (ref 7.0–26.0)
CO2: 25 meq/L (ref 22–29)
Calcium: 9 mg/dL (ref 8.4–10.4)
Chloride: 96 mEq/L — ABNORMAL LOW (ref 98–109)
Creatinine: 0.9 mg/dL (ref 0.7–1.3)
EGFR: 80 mL/min/{1.73_m2} — AB (ref >90)
Glucose: 213 mg/dl — ABNORMAL HIGH (ref 70–140)
POTASSIUM: 4.3 meq/L (ref 3.5–5.1)
Sodium: 132 mEq/L — ABNORMAL LOW (ref 136–145)
TOTAL PROTEIN: 6.3 g/dL — AB (ref 6.4–8.3)

## 2015-10-21 MED ORDER — MIRTAZAPINE 15 MG PO TABS: 15.0000 mg | ORAL_TABLET | Freq: Every day | ORAL | Status: DC

## 2015-10-21 MED ORDER — MORPHINE SULFATE ER 30 MG PO TBCR: 30.0000 mg | EXTENDED_RELEASE_TABLET | Freq: Two times a day (BID) | ORAL | Status: DC

## 2015-10-21 NOTE — Progress Notes (Signed)
75 year old man diagnosed with cancer of the pancreas.  He is a patient of Dr. [name].  Past medical history includes anxiety and jaundice.  Medications include Xanax, Remeron and Compazine.  Labs include sodium 132, glucose 154, and albumin 2.9 on February 23.  Height: 67.5 inches. Weight: 115.7 pounds. Usual body weight: 145-150 pounds. BMI: 17.84.  Patient reports poor appetite which is getting worse daily.  He has intermittent nausea. Complaints of constipation which is recently resolved with Senokot. Reports abdominal pain. Typically eats one meal a day.  Occasionally drinks one oral nutrition supplement.  Nutrition diagnosis: Inadequate oral intake related to poor appetite as evidenced by 30 pounds weight loss from usual body weight BMI of 17.84 which is underweight.  Intervention:  Patient was educated to increase snacks throughout the day to total 6 times focusing on high-calorie high-protein foods. Recommended patient try Ensure Plus or boost plus as tolerated. (Try to work up to 2 daily.) Reviewed other high-calorie high-protein foods and provided a fact sheet on increasing calories and protein and making the most of each bite. Reviewed strategies for improving constipation.  Provided fact sheet Educated patient about strategies for improving fatigue and provided a fact sheet Questions were answered.  Samples were provided.  Teach back method was used. Contact information was given.  Patient was encouraged to contact me with questions or concerns.  Monitoring, evaluation, goals: Patient will tolerate increased calories and protein to promote increased energy and improved quality-of-life.  Next visit: Monday, March 27, during infusion.  **Disclaimer: This note was dictated with voice recognition software. Similar sounding words can inadvertently be transcribed and this note may contain transcription errors which may not have been corrected upon publication of  note.**

## 2015-10-21 NOTE — Progress Notes (Signed)
Oncology Nurse Navigator Documentation  Oncology Nurse Navigator Flowsheets 10/21/2015  Navigator Location [facility]  Navigator Encounter Type Follow-up Appt  Telephone -  Abnormal Finding Date -  Confirmed Diagnosis Date -  Patient Visit Type MedOnc  Treatment Phase Pre-Tx/Tx Discussion--Gemzar/Abraxane every 2 weeks  Barriers/Navigation Needs Education  Education Pain/ Symptom Management;Preparing for Upcoming Treatment;Understanding Cancer/ Treatment Options; how Remeron helps with appetite/sleep/depression  Interventions Education Method  Referrals -  Coordination of Care -  Education Method Verbal;Written;Teach-back  Support Groups/Services -  Acuity -  Time Spent with Patient 45  *See patient instructions*. Escorted Roger Vaughn and family to meet with dietician. Patient expresses the need to downsize his home now since he has received the final diagnosis/stage of his disease. Plans on talking w/wife about getting a place where he does not need to do yard work and have so many stairs. Informed this can be a huge undertaking while he is in treatment-encouraged him to take his time in this process.

## 2015-10-21 NOTE — Patient Instructions (Signed)
[facility] Discharge Instructions  RECOMMENDATIONS MADE BY THE CONSULTANT AND ANY TEST RESULTS WILL BE SENT TO YOUR REFERRING PHYSICIAN.  EXAM FINDINGS BY THE PHYSICIAN TODAY AND SIGNS OR SYMPTOMS TO REPORT TO CLINIC OR PRIMARY PHYSICIAN:  MEDICATIONS PRESCRIBED:   MS Contin 30 mg every 12 hours for pain control Remeron 15 mg every bedtime Senna S--continue 2-4 tabs/day  INSTRUCTIONS GIVEN AND DISCUSSED: Increase fluid intake and protein intake  SPECIAL INSTRUCTIONS/FOLLOW-UP: Chemo to begin on 10/28/15--Abraxane/Gemzar IV every 2 weeks  Thank you for choosing [facility] to provide your oncology and hematology care.  To afford each patient quality time with our providers, please arrive at least 30 minutes before your scheduled appointment time.  With your help, our goal is to use those 30 minutes to complete the necessary work-up to ensure our physicians have the information they need to help with your evaluation and healthcare recommendations.     ___________________  Should you have questions after your visit to [facility], please contact our office at [phone] between the hours of 8:30 a.m. and 4:30 p.m.  Voicemails left after 4:00 p.m. will not be returned until the following business day.  For prescription refill requests, have your pharmacy contact our office with your prescription refill request. We request 24 hour notice for all refill requests.

## 2015-10-21 NOTE — Progress Notes (Addendum)
[facility] OFFICE PROGRESS NOTE   Diagnosis:  Pancreas cancer  INTERVAL HISTORY:   Roger Vaughn returns as scheduled. He continues to have abdominal pain. He is taking oxycodone every 4 hours. He has been constipated. He recently began Senokot-S. Bowels now moving. Appetite continues to be poor. He is losing weight. Energy level is also poor.  Objective:  Vital signs in last 24 hours:  Blood pressure 130/74, pulse 66, temperature 97.5 F (36.4 C), temperature source Oral, resp. rate 18, height 5' 7.5" (1.715 m), weight 115 lb 11.2 oz (52.481 kg), SpO2 100 %.    HEENT: Mild scleral icterus. No thrush or ulcers. Resp: Lungs clear bilaterally. Cardio: Regular rate and rhythm. GI: Abdomen is soft. No organomegaly. No mass. Vascular: No leg edema. Skin: Mild jaundice.    Lab Results:  Lab Results  Component Value Date   WBC 11.0* 10/10/2015   HGB 11.3* 10/10/2015   HCT 34.0* 10/10/2015   MCV 87.9 10/10/2015   PLT 238 10/10/2015   NEUTROABS 8.2* 10/10/2015    Imaging:  No results found.  Medications: I have reviewed the patient's current medications.  Assessment/Plan: 1. Pancreas cancer presenting with abdominal pain and jaundice  CT abdomen/pelvis 09/25/2015 with a mass in the head/uncinate process of the pancreas; diffuse biliary ductal dilatation secondary to obstruction of the central common bile duct at the head of the pancreas; multiple small hepatic hypodense lesions.  10/03/2015 upper EUS with findings of a 20 mm x 25 mm round hypoechoic mass in the head of the pancreas with upstream pancreatic and biliary ductal dilatation. The mass was noted to abut the portal vein at the confluence and invade the SMV for several centimeters. No obvious involvement of the celiac artery or SMA. No obvious peripancreatic adenopathy. Multiple biopsies were obtained. Pathology showed malignant cells consistent with adenocarcinoma.   10/03/2015 ERCP with stent  placement.  10/18/2015 MRI abdomen with widespread metastatic disease to the liver, large pancreatic head mass causing pancreatic ductal obstruction and probable malignant lymphadenopathy in the  portacaval nodal station. 2. Abdominal pain secondary to #1 3. Obstructive jaundice secondary to #1 status post stent placement 10/03/2015 4. Nausea 5. Glaucoma   Disposition: Roger Vaughn has been diagnosed with pancreas cancer. The recent MRI confirms metastatic disease involving the liver. MRI result and images reviewed with Roger Vaughn and his family at today's visit by Dr. Benay Spice. He understands that no therapy will be curative.   We discussed options to include a trial of chemotherapy versus a supportive care approach. He is interested in chemotherapy. We discussed treatment with gemcitabine/Abraxane on a 2 week schedule. We discussed potential toxicities associated with chemotherapy including myelosuppression, nausea, mouth sores, diarrhea or constipation, hair loss, allergic reaction. We reviewed potential toxicities associated with gemcitabine including fever, rash, pneumonitis. We discussed the potential for peripheral neuropathy with Abraxane. He is agreeable to proceed.  Pain control is suboptimal. He will begin MS Contin 30 mg every 12 hours and continue oxycodone as needed.  For appetite he will begin Remeron 15 mg at bedtime. He is meeting with the [place] nutritionist today.  He will return for cycle one gemcitabine/Abraxane on 10/28/2015. We will see him in follow-up prior to cycle 2 on 11/11/2015. He will contact the office in the interim with any problems.  Patient seen with Dr. Benay Spice. 25 minutes were spent face-to-face at today's visit with the majority of that time involved in counseling/coordination of care.    [name] ANP/GNP-BC  10/21/2015  2:34 PM This was a shared visit with [name]. Roger Vaughn was interviewed and examined. We reviewed the MRI images  with his family. He has metastatic pancreas cancer. He would like to proceed with a trial of chemotherapy. He will begin gemcitabine/Abraxane 10/28/2015.   We adjusted the narcotic regimen today.  [name], M.D.

## 2015-10-21 NOTE — Telephone Encounter (Signed)
Gave wife avs report and appointments for March °

## 2015-10-22 LAB — CANCER ANTIGEN 19-9: CA 19-9: 23719 U/mL — ABNORMAL HIGH (ref 0–35)

## 2015-10-25 ENCOUNTER — Other Ambulatory Visit: Payer: Medicare Other

## 2015-10-28 ENCOUNTER — Telehealth: Payer: Self-pay | Admitting: Nurse Practitioner

## 2015-10-28 ENCOUNTER — Other Ambulatory Visit: Payer: Self-pay | Admitting: Nurse Practitioner

## 2015-10-28 ENCOUNTER — Other Ambulatory Visit (HOSPITAL_BASED_OUTPATIENT_CLINIC_OR_DEPARTMENT_OTHER): Payer: Medicare Other

## 2015-10-28 ENCOUNTER — Ambulatory Visit: Payer: Medicare Other

## 2015-10-28 DIAGNOSIS — C250 Malignant neoplasm of head of pancreas: Secondary | ICD-10-CM

## 2015-10-28 LAB — CBC WITH DIFFERENTIAL/PLATELET
BASO%: 0.3 % (ref 0.0–2.0)
BASOS ABS: 0 10*3/uL (ref 0.0–0.1)
EOS%: 1.5 % (ref 0.0–7.0)
Eosinophils Absolute: 0.2 10*3/uL (ref 0.0–0.5)
HCT: 31.2 % — ABNORMAL LOW (ref 38.4–49.9)
HGB: 10.2 g/dL — ABNORMAL LOW (ref 13.0–17.1)
LYMPH#: 1.6 10*3/uL (ref 0.9–3.3)
LYMPH%: 12.5 % — ABNORMAL LOW (ref 14.0–49.0)
MCH: 29.1 pg (ref 27.2–33.4)
MCHC: 32.7 g/dL (ref 32.0–36.0)
MCV: 88.9 fL (ref 79.3–98.0)
MONO#: 1 10*3/uL — ABNORMAL HIGH (ref 0.1–0.9)
MONO%: 8 % (ref 0.0–14.0)
NEUT#: 9.8 10*3/uL — ABNORMAL HIGH (ref 1.5–6.5)
NEUT%: 77.7 % — AB (ref 39.0–75.0)
Platelets: 176 10*3/uL (ref 140–400)
RBC: 3.51 10*6/uL — AB (ref 4.20–5.82)
RDW: 15.4 % — ABNORMAL HIGH (ref 11.0–14.6)
WBC: 12.6 10*3/uL — ABNORMAL HIGH (ref 4.0–10.3)

## 2015-10-28 LAB — COMPREHENSIVE METABOLIC PANEL
ALBUMIN: 3.2 g/dL — AB (ref 3.5–5.0)
ALK PHOS: 174 U/L — AB (ref 40–150)
ALT: 42 U/L (ref 0–55)
AST: 40 U/L — AB (ref 5–34)
Anion Gap: 10 mEq/L (ref 3–11)
BILIRUBIN TOTAL: 3.02 mg/dL — AB (ref 0.20–1.20)
BUN: 16.6 mg/dL (ref 7.0–26.0)
CO2: 28 meq/L (ref 22–29)
Calcium: 9.1 mg/dL (ref 8.4–10.4)
Chloride: 97 mEq/L — ABNORMAL LOW (ref 98–109)
Creatinine: 1 mg/dL (ref 0.7–1.3)
EGFR: 77 mL/min/{1.73_m2} — ABNORMAL LOW (ref >90)
GLUCOSE: 196 mg/dL — AB (ref 70–140)
POTASSIUM: 3.9 meq/L (ref 3.5–5.1)
SODIUM: 135 meq/L — AB (ref 136–145)
TOTAL PROTEIN: 6.4 g/dL (ref 6.4–8.3)

## 2015-10-28 NOTE — Telephone Encounter (Signed)
sch appts per 3/13 pof. Sent message to [facility] for approval of 3/20 chemo date

## 2015-10-28 NOTE — Progress Notes (Signed)
Pt presented to the infusion room for his first Abraxane/Gemzar treatment.  Pts wife states he has been having auditory and visual hallucinations which started Friday evening and have her and her husband very worried.  Also, pts bilirubin remains elevated.  [name], NP notified and decision made to hold treatment for today and have patient return in 1 week for repeat of labs and possible treatment.  As for hallucinations, Lattie Haw instructed pt and wife to discontinue MS Contin and Remeron and to resume Oxycodone regimen.  Pt also complaining of constipation.  Pt instructed to start taking Miralax daily as well as increasing to 2 Senna twice daily.  All medication instructions typed out for patient and wife and both verbalized understanding.  Pt and wife informed to call us with an update tomorrow morning after pain meds have been discontinued.  Pt also informed to call us at any point with any other concerns or changes.  Pt given AVS with all of this information and our phone number and no further questions at time of discharge.  Pt discharged ambulatory accompanied by wife.

## 2015-10-28 NOTE — Patient Instructions (Signed)
For constipation: Start taking Miralax daily. Take 2 Senna twice per day.  For pain control:  Do not take Morphine or Remeron. Resume Oxycodone every 4 hours as needed for pain.  **Return next week for labs and chemotherapy treatment.  We will call you once this has been scheduled and let you know when these appointments are.** Please call us tomorrow morning 10/29/15 with an update. Please also call us in the meantime if you have any questions or concerns or if anything else changes.

## 2015-10-29 ENCOUNTER — Telehealth: Payer: Self-pay | Admitting: Nurse Practitioner

## 2015-10-29 ENCOUNTER — Other Ambulatory Visit: Payer: Self-pay | Admitting: *Deleted

## 2015-10-29 ENCOUNTER — Telehealth: Payer: Self-pay | Admitting: *Deleted

## 2015-10-29 DIAGNOSIS — C250 Malignant neoplasm of head of pancreas: Secondary | ICD-10-CM

## 2015-10-29 NOTE — Telephone Encounter (Signed)
Left message for patient to inform him of changed appt dates/time to 3/21 per Maggie being that chemo room is full on 3/20

## 2015-10-29 NOTE — Progress Notes (Signed)
Wife notified of appointment on 3/16 at 11:45/12:15 for lab/visit.

## 2015-10-29 NOTE — Telephone Encounter (Signed)
per pof to sch pt appt-per pof pt aware °

## 2015-10-29 NOTE — Telephone Encounter (Signed)
Oncology Nurse Navigator Documentation  Oncology Nurse Navigator Flowsheets 10/29/2015  Navigator Location [facility]  Navigator Encounter Type Telephone--wife  Telephone Outgoing Call;Patient Update  Abnormal Finding Date -  Confirmed Diagnosis Date -  Patient Visit Type -  Treatment Phase -  Barriers/Navigation Needs Education-  Education Other--put Miralax in his Ensure  Interventions Other-notified NP   Referrals -  Coordination of Care -  Education Method -  Support Groups/Services -  Acuity -  Time Spent with Patient 15  Wife reports he is not hallucinating anymore, but she feels his memory is impaired. He has been sleeping almost all day. Takes his pain pills #2 every 4 hours ATC. Refused to take his Senna or K+ and won't tell her why. Last BM was Sunday night at 10 pm. Snaps at her when she tries to get him to take meds. Only intake all day has been a can of Ensure. 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

## 2015-10-30 ENCOUNTER — Encounter: Payer: Self-pay | Admitting: Oncology

## 2015-10-30 NOTE — Progress Notes (Signed)
Spoke w/ pt's wife regarding copay assistance w/ [employer].  She will bring their SS award letters on 10/31/15 to submit w/ application.  Will also discuss the [place] with them.

## 2015-10-31 ENCOUNTER — Telehealth: Payer: Self-pay | Admitting: Nurse Practitioner

## 2015-10-31 ENCOUNTER — Encounter: Payer: Self-pay | Admitting: *Deleted

## 2015-10-31 ENCOUNTER — Telehealth: Payer: Self-pay | Admitting: *Deleted

## 2015-10-31 ENCOUNTER — Other Ambulatory Visit (HOSPITAL_BASED_OUTPATIENT_CLINIC_OR_DEPARTMENT_OTHER): Payer: Medicare Other

## 2015-10-31 ENCOUNTER — Encounter: Payer: Self-pay | Admitting: Oncology

## 2015-10-31 ENCOUNTER — Ambulatory Visit (HOSPITAL_BASED_OUTPATIENT_CLINIC_OR_DEPARTMENT_OTHER): Payer: Medicare Other | Admitting: Nurse Practitioner

## 2015-10-31 VITALS — BP 114/83 | HR 110 | Temp 98.1°F | Resp 19 | Ht 67.5 in | Wt 112.9 lb

## 2015-10-31 DIAGNOSIS — C787 Secondary malignant neoplasm of liver and intrahepatic bile duct: Secondary | ICD-10-CM

## 2015-10-31 DIAGNOSIS — C250 Malignant neoplasm of head of pancreas: Secondary | ICD-10-CM

## 2015-10-31 DIAGNOSIS — G893 Neoplasm related pain (acute) (chronic): Secondary | ICD-10-CM

## 2015-10-31 DIAGNOSIS — R17 Unspecified jaundice: Secondary | ICD-10-CM

## 2015-10-31 LAB — COMPREHENSIVE METABOLIC PANEL
ALT: 41 U/L (ref 0–55)
ANION GAP: 11 meq/L (ref 3–11)
AST: 38 U/L — ABNORMAL HIGH (ref 5–34)
Albumin: 3.4 g/dL — ABNORMAL LOW (ref 3.5–5.0)
Alkaline Phosphatase: 168 U/L — ABNORMAL HIGH (ref 40–150)
BUN: 11.4 mg/dL (ref 7.0–26.0)
CALCIUM: 9.4 mg/dL (ref 8.4–10.4)
CHLORIDE: 99 meq/L (ref 98–109)
CO2: 26 mEq/L (ref 22–29)
Creatinine: 0.9 mg/dL (ref 0.7–1.3)
EGFR: 79 mL/min/{1.73_m2} — AB (ref >90)
Glucose: 182 mg/dl — ABNORMAL HIGH (ref 70–140)
POTASSIUM: 3.8 meq/L (ref 3.5–5.1)
Sodium: 136 mEq/L (ref 136–145)
Total Bilirubin: 2.9 mg/dL — ABNORMAL HIGH (ref 0.20–1.20)
Total Protein: 6.9 g/dL (ref 6.4–8.3)

## 2015-10-31 MED ORDER — OXYCODONE HCL 5 MG PO TABS: 5.0000 mg | ORAL_TABLET | ORAL | Status: DC | PRN

## 2015-10-31 NOTE — Telephone Encounter (Signed)
cld and spoke to daisy(wife)adv of time & date for 3/21 appt@10 :45

## 2015-10-31 NOTE — Progress Notes (Signed)
Pt's wife decided to wait on applying for assistance w/ [employer].  She wanted to take the application w/ her.  I already completed the demographics section for him and gave her the application along with my card to call if she changes her mind.

## 2015-10-31 NOTE — Telephone Encounter (Signed)
per pof to sch pt appt-sent MW email to move trmt to coordinate w/MD appt-will call pt after reply

## 2015-10-31 NOTE — Telephone Encounter (Signed)
"  We were there today and I forgot to ask when he begins treatment next Tuesday, if it works, how much life expectancy will he have?  Can you all call me today.  His sons in every state are calling and asking me approximately how long he could have with treatment."   Daisy's return number [phone].

## 2015-10-31 NOTE — Progress Notes (Addendum)
[facility] OFFICE PROGRESS NOTE   Diagnosis:  Pancreas cancer  INTERVAL HISTORY:   Roger Vaughn returns prior to scheduled follow-up. He was scheduled for cycle 1 gemcitabine/Abraxane 10/28/2015. Treatment was held due to persistent elevation of the bilirubin as well as altered mental status. His long-acting pain medicine and Remeron were discontinued.  The confusion is better. He is no longer hallucinating. He is now taking oxycodone for pain control. He typically takes the oxycodone every 4 hours. He's had a few episodes of vomiting. He had 2 loose stools yesterday after taking a laxative. No loose stools today. Appetite remains poor. Energy level is also poor.  Objective:  Vital signs in last 24 hours:  Blood pressure 114/83, pulse 110, temperature 98.1 F (36.7 C), temperature source Oral, resp. rate 19, height 5' 7.5" (1.715 m), weight 112 lb 14.4 oz (51.211 kg), SpO2 100 %.    HEENT: No thrush or ulcers. Resp: Lungs clear bilaterally. Cardio: Regular rate and rhythm. GI: Abdomen is soft with mild tenderness over the upper abdomen. No hepatomegaly. Vascular: No leg edema. Neuro: Alert and oriented. Follows commands.  Skin: Mild jaundice.    Lab Results:  Lab Results  Component Value Date   WBC 12.6* 10/28/2015   HGB 10.2* 10/28/2015   HCT 31.2* 10/28/2015   MCV 88.9 10/28/2015   PLT 176 10/28/2015   NEUTROABS 9.8* 10/28/2015    Imaging:  No results found.  Medications: I have reviewed the patient's current medications.  Assessment/Plan: 1. Pancreas cancer presenting with abdominal pain and jaundice  CT abdomen/pelvis 09/25/2015 with a mass in the head/uncinate process of the pancreas; diffuse biliary ductal dilatation secondary to obstruction of the central common bile duct at the head of the pancreas; multiple small hepatic hypodense lesions.  10/03/2015 upper EUS with findings of a 20 mm x 25 mm round hypoechoic mass in the head of the  pancreas with upstream pancreatic and biliary ductal dilatation. The mass was noted to abut the portal vein at the confluence and invade the SMV for several centimeters. No obvious involvement of the celiac artery or SMA. No obvious peripancreatic adenopathy. Multiple biopsies were obtained. Pathology showed malignant cells consistent with adenocarcinoma.   10/03/2015 ERCP with stent placement.  10/18/2015 MRI abdomen with widespread metastatic disease to the liver, large pancreatic head mass causing pancreatic ductal obstruction and probable malignant lymphadenopathy in the portacaval nodal station. 2. Abdominal pain secondary to #1 3. Obstructive jaundice secondary to #1 status post stent placement 10/03/2015 4. Nausea 5. Glaucoma 6. Altered mental status 10/28/2015. Long-acting pain medication and Remeron discontinued. Mental status improved 10/31/2015.   Disposition: Roger Vaughn has metastatic pancreas cancer. He continues to have a poor performance status. Initiation of gemcitabine/Abraxane was held earlier this week due to persistent elevation of the bilirubin and altered mental status.  The confusion is better following discontinuation of the long-acting narcotic and Remeron. The confusion was likely related to the long-acting narcotic. He will resume Remeron. He will continue oxycodone for abdominal pain.  The bilirubin is significantly improved but remains elevated. He and his wife understand the persistent elevation at this point is likely related to metastatic disease involving the liver.  Dr. Benay Spice discussed proceeding with gemcitabine/Abraxane with a dose reduction of the Abraxane, treatment with gemcitabine alone, supportive care. He would like to proceed with gemcitabine/Abraxane. He understands there is increased risk for toxicity due to the elevated bilirubin. He is willing to accept this risk.  He would like to return  as scheduled on 11/05/2015 for cycle one  gemcitabine/Abraxane. We will see him in follow-up prior to proceeding with treatment on that day. He will contact the office in the interim with any problems.  Patient seen with Dr. Benay Spice.    [name] ANP/GNP-BC   10/31/2015  12:24 PM   this was a shared visit with [name]. His confusion has improved with discontinuation of MS Contin. The bilirubin remains elevated. We discussed the potential increased toxicity with chemotherapy while the bilirubin is elevated. His performance status continues to decline. We discussed supportive care versus a trial of chemotherapy. He would like to proceed with chemotherapy. He will be scheduled for gemcitabine/Abraxane next week.   [name], M.D.

## 2015-10-31 NOTE — Progress Notes (Signed)
Oncology Nurse Navigator Documentation  Oncology Nurse Navigator Flowsheets 10/31/2015  Navigator Location [facility]  Navigator Encounter Type Follow-up Appt  Telephone -  Abnormal Finding Date -  Confirmed Diagnosis Date -  Patient Visit Type Follow-up  Treatment Phase Pre-Tx/Tx Discussion--  Barriers/Navigation Needs Education  Education Pain/ Symptom Management;Other--assistance at home for wife to be able to leave  Interventions Referrals;Education Method  Referrals Social Work  Coordination of Care -  Education Method Verbal  Support Groups/Services -  Acuity Level 2  Time Spent with Patient 30  Explained to patient that he needs to push po fluids and increase his protein intake-increase daily ensure to bid. Try drinking Muscle Milk (tastes better). Eat small snack every 2 hours. Needs to get up and sleep less during the day explaining he will get weaker the more he sleeps. Also cautioned him that he is a very high fall risk. He and his wife live here alone-kids are in another state. Wife is very stressed w/being caregiver and not safe to leave him alone to shop or run errands. CSW will call her about home care options, which she was notified that she may need to pay out of pocket for this.

## 2015-10-31 NOTE — Telephone Encounter (Signed)
I have adjusted 3/21 appts

## 2015-10-31 NOTE — Telephone Encounter (Signed)
Call placed to patient's wife by L. Marcello Moores NP.

## 2015-11-01 ENCOUNTER — Ambulatory Visit (HOSPITAL_BASED_OUTPATIENT_CLINIC_OR_DEPARTMENT_OTHER): Payer: Medicare Other | Admitting: Nurse Practitioner

## 2015-11-01 ENCOUNTER — Ambulatory Visit (HOSPITAL_BASED_OUTPATIENT_CLINIC_OR_DEPARTMENT_OTHER): Payer: Medicare Other

## 2015-11-01 ENCOUNTER — Telehealth: Payer: Self-pay | Admitting: Nurse Practitioner

## 2015-11-01 ENCOUNTER — Encounter: Payer: Self-pay | Admitting: *Deleted

## 2015-11-01 ENCOUNTER — Other Ambulatory Visit: Payer: Self-pay | Admitting: Nurse Practitioner

## 2015-11-01 VITALS — BP 122/86 | HR 90 | Temp 97.6°F | Resp 18 | Wt 113.3 lb

## 2015-11-01 VITALS — BP 146/75 | HR 76 | Resp 18

## 2015-11-01 DIAGNOSIS — G893 Neoplasm related pain (acute) (chronic): Secondary | ICD-10-CM | POA: Diagnosis not present

## 2015-11-01 DIAGNOSIS — C787 Secondary malignant neoplasm of liver and intrahepatic bile duct: Secondary | ICD-10-CM | POA: Diagnosis not present

## 2015-11-01 DIAGNOSIS — R112 Nausea with vomiting, unspecified: Secondary | ICD-10-CM

## 2015-11-01 DIAGNOSIS — C250 Malignant neoplasm of head of pancreas: Secondary | ICD-10-CM | POA: Diagnosis not present

## 2015-11-01 DIAGNOSIS — R109 Unspecified abdominal pain: Secondary | ICD-10-CM

## 2015-11-01 MED ORDER — SODIUM CHLORIDE 0.9 % IV SOLN
INTRAVENOUS | Status: DC
  Administered 2015-11-01: 12:00:00 via INTRAVENOUS

## 2015-11-01 MED ORDER — ONDANSETRON HCL 40 MG/20ML IJ SOLN
Freq: Once | INTRAMUSCULAR | Status: AC
  Administered 2015-11-01: 13:00:00 via INTRAVENOUS
  Filled 2015-11-01: qty 4

## 2015-11-01 NOTE — Progress Notes (Signed)
[facility] Work  Clinical Social Work was referred by patient navigator for possible home care assistance.  Clinical Social Worker phoned wife as requested, wife stated they were leaving the house to come in to be seen. CSW will follow up at appointment or at later date.   Loren Racer, [place] Worker [place]  [place] Phone: [phone] Fax: [phone]

## 2015-11-01 NOTE — Telephone Encounter (Signed)
Per 03/17 POF, added NP/LT pt is on his way here.... KJ

## 2015-11-01 NOTE — Telephone Encounter (Signed)
TC from Pt.'s wife [place] Ropes) stated Pt. Wasn't feeling well. Pt. Has been vomiting all night and is in pain. Informed Lattie Haw stated Pt. Should come in today. POF sent to add Pt. To Lisa's schedule.

## 2015-11-01 NOTE — Progress Notes (Signed)
[place] Work  Clinical Social Work was referred by patient navigator for assessment of psychosocial needs.  Clinical Social Worker met with patient and wife at [facility] to offer support and assess for needs.  CSW provided wife with CNA list. Wife was very anxious and awaiting to meet with NP. CSW provided wife with CSW card as well. CSW willing to assist as needed.   Clinical Social Work interventions: Resource assistance  Loren Racer, [place] Worker [place]  [place] Phone: [phone] Fax: [phone]

## 2015-11-01 NOTE — Progress Notes (Signed)
Patient is tolerating IVF well and feels well after Zofran. Given soup and juice for po challenge per Lattie Haw NP.

## 2015-11-01 NOTE — Progress Notes (Addendum)
Roger Vaughn OFFICE PROGRESS NOTE   Diagnosis:   Pancreas cancer  INTERVAL HISTORY:   Roger Vaughn returns prior to scheduled follow-up. Around 11:00 last night he began vomiting. He has vomited 6 or 7 times. He last vomited at 9 AM this morning. Last bowel movement was 2 days ago, described as "diarrhea". He did not have a bowel movement yesterday. The loose stool occurred following initiation of laxatives. He continues to have abdominal pain. He is taking oxycodone. No fever.  He had a "good" bowel movement while in the office today. Following the bowel movement he felt much better.  Objective:  Vital signs in last 24 hours:  Blood pressure 122/86, pulse 90, temperature 97.6 F (36.4 C), temperature source Oral, resp. rate 18, weight 113 lb 5 oz (51.398 kg), SpO2 100 %.    HEENT: No thrush or ulcers. Resp: Lungs clear bilaterally. Cardio: Regular rate and rhythm. GI: Abdomen is soft. Tender over the right upper abdomen. No hepatomegaly. Bowel sounds present. Vascular: No leg edema. Neuro: Alert and oriented. Hard of hearing.  Skin: Diminished skin turgor.    Lab Results:  Lab Results  Component Value Date   WBC 12.6* 10/28/2015   HGB 10.2* 10/28/2015   HCT 31.2* 10/28/2015   MCV 88.9 10/28/2015   PLT 176 10/28/2015   NEUTROABS 9.8* 10/28/2015    Imaging:  No results found.  Medications: I have reviewed the patient's current medications.  Assessment/Plan: 1. Pancreas cancer presenting with abdominal pain and jaundice  CT abdomen/pelvis 09/25/2015 with a mass in the head/uncinate process of the pancreas; diffuse biliary ductal dilatation secondary to obstruction of the central common bile duct at the head of the pancreas; multiple small hepatic hypodense lesions.  10/03/2015 upper EUS with findings of a 20 mm x 25 mm round hypoechoic mass in the head of the pancreas with upstream pancreatic and biliary ductal dilatation. The mass was noted to abut the  portal vein at the confluence and invade the SMV for several centimeters. No obvious involvement of the celiac artery or SMA. No obvious peripancreatic adenopathy. Multiple biopsies were obtained. Pathology showed malignant cells consistent with adenocarcinoma.   10/03/2015 ERCP with stent placement.  10/18/2015 MRI abdomen with widespread metastatic disease to the liver, large pancreatic head mass causing pancreatic ductal obstruction and probable malignant lymphadenopathy in the portacaval nodal station. 2. Abdominal pain secondary to #1 3. Obstructive jaundice secondary to #1 status post stent placement 10/03/2015 4. Nausea 5. Glaucoma 6. Altered mental status 10/28/2015. Long-acting pain medication and Remeron discontinued. Mental status improved 10/31/2015.    Disposition: Roger Vaughn presents today with acute nausea/vomiting beginning last night. He has vomited multiple times. He had a bowel movement in the office and feels better. We have started an IV and he is receiving a liter of normal saline. We will also give Zofran 8 mg IV. He will try liquids as tolerated while here. If he is able to tolerate liquids he will be discharged home and we will see him back as scheduled next week. If he is unable to tolerate liquids we will need to consider hospitalization.  Addendum-he completed the IV fluids. No significant nausea. No further vomiting. He was able to tolerate fluids orally without difficulty. He will be discharged to home and will follow-up as scheduled.  Patient seen with Dr. Benay Spice. 25 minutes were spent face-to-face at today's visit with the majority of that time involved in counseling/coordination of care.  [name] ANP/GNP-BC  11/01/2015  11:50 AM   this was a shared visit with [name]. Roger Vaughn was interviewed and examined.  The nausea/vomiting may be related to early obstruction from pancreas cancer, constipation, liver metastases, or another etiology. We  discussed the prognosis and treatment options with the patient and his family. He was able to complete IV fluids and take fluids by mouth without further vomiting today. He will return as scheduled 11/05/2015 to decide on a trial of chemotherapy versus Hospice care.   [name], M.D.

## 2015-11-01 NOTE — Patient Instructions (Signed)

## 2015-11-04 ENCOUNTER — Telehealth: Payer: Self-pay

## 2015-11-04 NOTE — Telephone Encounter (Signed)
TC from Pt' s wife to inform Owens Shark NP  that Pt. Is doing much better. Drinking,eating, and sleeping at night.confirmed appointment for tomorrow.

## 2015-11-05 ENCOUNTER — Ambulatory Visit (HOSPITAL_BASED_OUTPATIENT_CLINIC_OR_DEPARTMENT_OTHER): Payer: Medicare Other | Admitting: Nurse Practitioner

## 2015-11-05 ENCOUNTER — Encounter: Payer: Self-pay | Admitting: *Deleted

## 2015-11-05 ENCOUNTER — Other Ambulatory Visit: Payer: Medicare Other

## 2015-11-05 ENCOUNTER — Other Ambulatory Visit (HOSPITAL_BASED_OUTPATIENT_CLINIC_OR_DEPARTMENT_OTHER): Payer: Medicare Other

## 2015-11-05 ENCOUNTER — Ambulatory Visit (HOSPITAL_BASED_OUTPATIENT_CLINIC_OR_DEPARTMENT_OTHER): Payer: Medicare Other

## 2015-11-05 ENCOUNTER — Ambulatory Visit: Payer: Medicare Other | Admitting: Nutrition

## 2015-11-05 VITALS — BP 91/63 | HR 107 | Temp 98.1°F | Resp 18 | Ht 67.5 in | Wt 112.6 lb

## 2015-11-05 VITALS — BP 123/67 | HR 73 | Temp 97.8°F | Resp 20

## 2015-11-05 DIAGNOSIS — R109 Unspecified abdominal pain: Secondary | ICD-10-CM

## 2015-11-05 DIAGNOSIS — C20 Malignant neoplasm of rectum: Secondary | ICD-10-CM | POA: Diagnosis not present

## 2015-11-05 DIAGNOSIS — C250 Malignant neoplasm of head of pancreas: Secondary | ICD-10-CM

## 2015-11-05 DIAGNOSIS — Z5111 Encounter for antineoplastic chemotherapy: Secondary | ICD-10-CM | POA: Diagnosis not present

## 2015-11-05 DIAGNOSIS — R17 Unspecified jaundice: Secondary | ICD-10-CM

## 2015-11-05 DIAGNOSIS — C787 Secondary malignant neoplasm of liver and intrahepatic bile duct: Secondary | ICD-10-CM

## 2015-11-05 LAB — CBC WITH DIFFERENTIAL/PLATELET
BASO%: 0.4 % (ref 0.0–2.0)
BASOS ABS: 0.1 10*3/uL (ref 0.0–0.1)
EOS%: 0.7 % (ref 0.0–7.0)
Eosinophils Absolute: 0.1 10*3/uL (ref 0.0–0.5)
HEMATOCRIT: 30 % — AB (ref 38.4–49.9)
HGB: 10.1 g/dL — ABNORMAL LOW (ref 13.0–17.1)
LYMPH#: 1 10*3/uL (ref 0.9–3.3)
LYMPH%: 7.1 % — AB (ref 14.0–49.0)
MCH: 29.5 pg (ref 27.2–33.4)
MCHC: 33.7 g/dL (ref 32.0–36.0)
MCV: 87.7 fL (ref 79.3–98.0)
MONO#: 1 10*3/uL — ABNORMAL HIGH (ref 0.1–0.9)
MONO%: 6.7 % (ref 0.0–14.0)
NEUT#: 12.2 10*3/uL — ABNORMAL HIGH (ref 1.5–6.5)
NEUT%: 85.1 % — AB (ref 39.0–75.0)
Platelets: 162 10*3/uL (ref 140–400)
RBC: 3.42 10*6/uL — AB (ref 4.20–5.82)
RDW: 15.8 % — ABNORMAL HIGH (ref 11.0–14.6)
WBC: 14.3 10*3/uL — ABNORMAL HIGH (ref 4.0–10.3)

## 2015-11-05 LAB — COMPREHENSIVE METABOLIC PANEL
ALT: 44 U/L (ref 0–55)
AST: 50 U/L — AB (ref 5–34)
Albumin: 3.2 g/dL — ABNORMAL LOW (ref 3.5–5.0)
Alkaline Phosphatase: 166 U/L — ABNORMAL HIGH (ref 40–150)
Anion Gap: 9 mEq/L (ref 3–11)
BUN: 20.3 mg/dL (ref 7.0–26.0)
CALCIUM: 9 mg/dL (ref 8.4–10.4)
CHLORIDE: 97 meq/L — AB (ref 98–109)
CO2: 28 meq/L (ref 22–29)
Creatinine: 0.9 mg/dL (ref 0.7–1.3)
EGFR: 80 mL/min/{1.73_m2} — ABNORMAL LOW (ref >90)
Glucose: 254 mg/dl — ABNORMAL HIGH (ref 70–140)
POTASSIUM: 3.4 meq/L — AB (ref 3.5–5.1)
SODIUM: 135 meq/L — AB (ref 136–145)
Total Bilirubin: 2.57 mg/dL — ABNORMAL HIGH (ref 0.20–1.20)
Total Protein: 6.2 g/dL — ABNORMAL LOW (ref 6.4–8.3)

## 2015-11-05 MED ORDER — SODIUM CHLORIDE 0.9 % IV SOLN
Freq: Once | INTRAVENOUS | Status: AC
  Administered 2015-11-05: 13:00:00 via INTRAVENOUS
  Filled 2015-11-05: qty 4

## 2015-11-05 MED ORDER — PACLITAXEL PROTEIN-BOUND CHEMO INJECTION 100 MG
75.0000 mg/m2 | Freq: Once | INTRAVENOUS | Status: AC
  Administered 2015-11-05: 125 mg via INTRAVENOUS
  Filled 2015-11-05: qty 25

## 2015-11-05 MED ORDER — SODIUM CHLORIDE 0.9 % IV SOLN
Freq: Once | INTRAVENOUS | Status: AC
  Administered 2015-11-05: 13:00:00 via INTRAVENOUS

## 2015-11-05 MED ORDER — SODIUM CHLORIDE 0.9 % IV SOLN
800.0000 mg/m2 | Freq: Once | INTRAVENOUS | Status: AC
  Administered 2015-11-05: 1254 mg via INTRAVENOUS
  Filled 2015-11-05: qty 32.98

## 2015-11-05 NOTE — Progress Notes (Addendum)
[facility] OFFICE PROGRESS NOTE   Diagnosis:  Pancreas cancer  INTERVAL HISTORY:   [name] returns as scheduled. He is doing somewhat better. His wife thinks oral intake has improved. He has had a single episode of nausea with dry heaves. He had a loose stool this morning. Abdominal pain is unchanged. He continues oxycodone but is not requiring it as frequently. No further confusion.  Objective:  Vital signs in last 24 hours:  Blood pressure 91/63, pulse 107, temperature 98.1 F (36.7 C), resp. rate 18, height 5' 7.5" (1.715 m), weight 112 lb 9.6 oz (51.075 kg), SpO2 100 %.    HEENT: No thrush or ulcers. Resp: Lungs clear bilaterally. Cardio: Regular rate and rhythm. GI: Abdomen is soft. No hepatomegaly. Vascular: No leg edema. Neuro: Alert and oriented.  Skin: Diminished skin turgor.    Lab Results:  Lab Results  Component Value Date   WBC 14.3* 11/05/2015   HGB 10.1* 11/05/2015   HCT 30.0* 11/05/2015   MCV 87.7 11/05/2015   PLT 162 11/05/2015   NEUTROABS 12.2* 11/05/2015    Imaging:  No results found.  Medications: I have reviewed the patient's current medications.  Assessment/Plan: 1. Pancreas cancer presenting with abdominal pain and jaundice  CT abdomen/pelvis 09/25/2015 with a mass in the head/uncinate process of the pancreas; diffuse biliary ductal dilatation secondary to obstruction of the central common bile duct at the head of the pancreas; multiple small hepatic hypodense lesions.  10/03/2015 upper EUS with findings of a 20 mm x 25 mm round hypoechoic mass in the head of the pancreas with upstream pancreatic and biliary ductal dilatation. The mass was noted to abut the portal vein at the confluence and invade the SMV for several centimeters. No obvious involvement of the celiac artery or SMA. No obvious peripancreatic adenopathy. Multiple biopsies were obtained. Pathology showed malignant cells consistent with adenocarcinoma.    10/03/2015 ERCP with stent placement.  10/18/2015 MRI abdomen with widespread metastatic disease to the liver, large pancreatic head mass causing pancreatic ductal obstruction and probable malignant lymphadenopathy in the portacaval nodal station.  Cycle 1 gemcitabine/Abraxane 11/05/2015 2. Abdominal pain secondary to #1 3. Obstructive jaundice secondary to #1 status post stent placement 10/03/2015 4. Nausea 5. Glaucoma 6. Altered mental status 10/28/2015. Long-acting pain medication and Remeron discontinued. Mental status improved 10/31/2015.   Disposition: Roger Vaughn continues to have a poor performance. We discussed that he is at increased risk for toxicity from the chemotherapy due to his performance status as well as the persistent mildly elevated bilirubin. We discussed a supportive care approach with a hospice referral. He understands that he is at increased risk for toxicity with the treatment and is willing to accept this risk. He would like to proceed with cycle 1 gemcitabine/Abraxane today as scheduled. We will schedule a return visit in approximately one week.  Patient seen with Dr. Benay Spice. 25 minutes were spent face-to-face at today's visit with the majority of that time involved in counseling/coordination of care.  [name] ANP/GNP-BC   11/05/2015  11:13 AM  This was a shared visit with [name]. Roger Vaughn has a borderline performance status to receive systemic therapy. I recommended Hospice care. We discussed the risks associated with chemotherapy. He would like to proceed with a trial of gemcitabine/Abraxane. He understands the increased potential for toxicity given the liver dysfunction and his poor performance status.  The plan is to proceed with dose reduced gemcitabine/Abraxane. He will return for an office visit in  one week.  [name], M.D.

## 2015-11-05 NOTE — Progress Notes (Signed)
Nutrition follow-up completed with patient during infusion for cancer of the pancreas. Weight has decreased and was documented as 112.6 pounds on March 21. Oral intake remains inadequate with patient taking bites of food several times a day. He will drink one or 2 oral nutrition supplements daily. Patient complains of taste alterations. Reports diarrhea continues.  Nutrition diagnosis: Inadequate oral intake continues.  Intervention: I encouraged patient to increase oral nutrition supplements 3-4 bottles daily. Provided samples and coupons. Encouraged patient to take bites and sips at mealtimes and slowly work to increase oral intake of food Educated patient on strategies for improving taste alterations and provided fact sheets. Questions were answered.  Teach back method was used.  Monitoring, evaluation, goals: Patient will work to increase oral intake for improved energy and improved quality-of-life.  Next visit: Monday, April 3, during infusion.  **Disclaimer: This note was dictated with voice recognition software. Similar sounding words can inadvertently be transcribed and this note may contain transcription errors which may not have been corrected upon publication of note.**

## 2015-11-05 NOTE — Progress Notes (Signed)
Oncology Nurse Navigator Documentation  Oncology Nurse Navigator Flowsheets 11/05/2015  Navigator Location [facility]  Navigator Encounter Type Treatment  Telephone -  Abnormal Finding Date -  Confirmed Diagnosis Date -  Treatment Initiated Date 11/05/2015  Patient Visit Type MedOnc  Treatment Phase First Chemo Tx  Barriers/Navigation Needs Education;Family concerns--performance status  Education Pain/ Symptom Management  Interventions Education Method  Referrals -  Coordination of Care -  Education Method Verbal  Support Groups/Services -  Acuity -  Time Spent with Patient 15  Met with patient and his son during initial chemotherapy treatment to provide support and assess for needs to promote continuity of care: 1. Discussed antiemetic regimen at home and need to get more active and eat/drink more to get stronger 2. Discuss at home need for W/C or walker or any equipment needed 3. Discussed potential side effects from his chemo over next few days Son talked about taking him to the beach over the weekend-encouraged them to wait and try this after his 2nd treatment to see how he tolerates. Would be good idea to take him out in the car to see things and encourage visitors to keep him up more. Suggested he talk w/wife about referral to Palliative Care to help him and especially his wife cope with his disease. Confirmed w/son it would be good for patient to get his affairs in order-he does not have a will according to the son. Informed Kiah that navigator will call and check on him tomorrow.  Merceda Elks, RN, BSN GI Oncology [place]

## 2015-11-05 NOTE — Patient Instructions (Addendum)
[facility] Discharge Instructions for Patients Receiving Chemotherapy  Today you received the following chemotherapy agents: Abraxane and Gemzar.  To help prevent nausea and vomiting after your treatment, we encourage you to take your nausea medication as directed.  BELOW ARE SYMPTOMS THAT SHOULD BE REPORTED IMMEDIATELY:  *FEVER GREATER THAN 100.5 F  *CHILLS WITH OR WITHOUT FEVER  NAUSEA AND VOMITING THAT IS NOT CONTROLLED WITH YOUR NAUSEA MEDICATION  *UNUSUAL SHORTNESS OF BREATH  *UNUSUAL BRUISING OR BLEEDING f you develop nausea and vomiting that is not controlled by your nausea medication, call the clinic.   TENDERNESS IN MOUTH AND THROAT WITH OR WITHOUT PRESENCE OF ULCERS  *URINARY PROBLEMS  *BOWEL PROBLEMS  UNUSUAL RASH Items with * indicate a potential emergency and should be followed up as soon as possible.  .Nanoparticle Albumin-Bound Paclitaxel injection What is this medicine? NANOPARTICLE ALBUMIN-BOUND PACLITAXEL (Na no PAHR ti kuhl al BYOO muhn-bound PAK li TAX el) is a chemotherapy drug. It targets fast dividing cells, like cancer cells, and causes these cells to die. This medicine is used to treat advanced breast cancer and advanced lung cancer. This medicine may be used for other purposes; ask your health care provider or pharmacist if you have questions. What should I tell my health care provider before I take this medicine? They need to know if you have any of these conditions: -kidney disease -liver disease -low blood counts, like low platelets, red blood cells, or white blood cells -recent or ongoing radiation therapy -an unusual or allergic reaction to paclitaxel, albumin, other chemotherapy, other medicines, foods, dyes, or preservatives -pregnant or trying to get pregnant -breast-feeding How should I use this medicine? This drug is given as an infusion into a vein. It is administered in a hospital or clinic by a specially trained health  care professional. Talk to your pediatrician regarding the use of this medicine in children. Special care may be needed. Overdosage: If you think you have taken too much of this medicine contact a poison control center or emergency room at once. NOTE: This medicine is only for you. Do not share this medicine with others. What if I miss a dose? It is important not to miss your dose. Call your doctor or health care professional if you are unable to keep an appointment. What may interact with this medicine? -cyclosporine -diazepam -ketoconazole -medicines to increase blood counts like filgrastim, pegfilgrastim, sargramostim -other chemotherapy drugs like cisplatin, doxorubicin, epirubicin, etoposide, teniposide, vincristine -quinidine -testosterone -vaccines -verapamil Talk to your doctor or health care professional before taking any of these medicines: -acetaminophen -aspirin -ibuprofen -ketoprofen -naproxen This list may not describe all possible interactions. Give your health care provider a list of all the medicines, herbs, non-prescription drugs, or dietary supplements you use. Also tell them if you smoke, drink alcohol, or use illegal drugs. Some items may interact with your medicine. What should I watch for while using this medicine? Your condition will be monitored carefully while you are receiving this medicine. You will need important blood work done while you are taking this medicine. This drug may make you feel generally unwell. This is not uncommon, as chemotherapy can affect healthy cells as well as cancer cells. Report any side effects. Continue your course of treatment even though you feel ill unless your doctor tells you to stop. In some cases, you may be given additional medicines to help with side effects. Follow all directions for their use. Call your doctor or health care professional for advice  if you get a fever, chills or sore throat, or other symptoms of a cold or  flu. Do not treat yourself. This drug decreases your body's ability to fight infections. Try to avoid being around people who are sick. This medicine may increase your risk to bruise or bleed. Call your doctor or health care professional if you notice any unusual bleeding. Be careful brushing and flossing your teeth or using a toothpick because you may get an infection or bleed more easily. If you have any dental work done, tell your dentist you are receiving this medicine. Avoid taking products that contain aspirin, acetaminophen, ibuprofen, naproxen, or ketoprofen unless instructed by your doctor. These medicines may hide a fever. Do not become pregnant while taking this medicine. Women should inform their doctor if they wish to become pregnant or think they might be pregnant. There is a potential for serious side effects to an unborn child. Talk to your health care professional or pharmacist for more information. Do not breast-feed an infant while taking this medicine. Men are advised not to father a child while receiving this medicine. What side effects may I notice from receiving this medicine? Side effects that you should report to your doctor or health care professional as soon as possible: -allergic reactions like skin rash, itching or hives, swelling of the face, lips, or tongue -low blood counts - This drug may decrease the number of white blood cells, red blood cells and platelets. You may be at increased risk for infections and bleeding. -signs of infection - fever or chills, cough, sore throat, pain or difficulty passing urine -signs of decreased platelets or bleeding - bruising, pinpoint red spots on the skin, black, tarry stools, nosebleeds -signs of decreased red blood cells - unusually weak or tired, fainting spells, lightheadedness -breathing problems -changes in vision -chest pain -high or low blood pressure -mouth sores -nausea and vomiting -pain, swelling, redness or  irritation at the injection site -pain, tingling, numbness in the hands or feet -slow or irregular heartbeat -swelling of the ankle, feet, hands Side effects that usually do not require medical attention (report to your doctor or health care professional if they continue or are bothersome): -aches, pains -changes in the color of fingernails -diarrhea -hair loss -loss of appetite This list may not describe all possible side effects. Call your doctor for medical advice about side effects. You may report side effects to FDA at 1-800-FDA-1088. Where should I keep my medicine? This drug is given in a hospital or clinic and will not be stored at home. NOTE: This sheet is a summary. It may not cover all possible information. If you have questions about this medicine, talk to your doctor, pharmacist, or health care provider.    2016, Elsevier/Gold Standard. (2012-09-26 16:48:50) Gemcitabine injection What is this medicine? GEMCITABINE (jem SIT a been) is a chemotherapy drug. This medicine is used to treat many types of cancer like breast cancer, lung cancer, pancreatic cancer, and ovarian cancer. This medicine may be used for other purposes; ask your health care provider or pharmacist if you have questions. What should I tell my health care provider before I take this medicine? They need to know if you have any of these conditions: -blood disorders -infection -kidney disease -liver disease -recent or ongoing radiation therapy -an unusual or allergic reaction to gemcitabine, other chemotherapy, other medicines, foods, dyes, or preservatives -pregnant or trying to get pregnant -breast-feeding How should I use this medicine? This drug is given as an  infusion into a vein. It is administered in a hospital or clinic by a specially trained health care professional. Talk to your pediatrician regarding the use of this medicine in children. Special care may be needed. Overdosage: If you think you  have taken too much of this medicine contact a poison control center or emergency room at once. NOTE: This medicine is only for you. Do not share this medicine with others. What if I miss a dose? It is important not to miss your dose. Call your doctor or health care professional if you are unable to keep an appointment. What may interact with this medicine? -medicines to increase blood counts like filgrastim, pegfilgrastim, sargramostim -some other chemotherapy drugs like cisplatin -vaccines Talk to your doctor or health care professional before taking any of these medicines: -acetaminophen -aspirin -ibuprofen -ketoprofen -naproxen This list may not describe all possible interactions. Give your health care provider a list of all the medicines, herbs, non-prescription drugs, or dietary supplements you use. Also tell them if you smoke, drink alcohol, or use illegal drugs. Some items may interact with your medicine. What should I watch for while using this medicine? Visit your doctor for checks on your progress. This drug may make you feel generally unwell. This is not uncommon, as chemotherapy can affect healthy cells as well as cancer cells. Report any side effects. Continue your course of treatment even though you feel ill unless your doctor tells you to stop. In some cases, you may be given additional medicines to help with side effects. Follow all directions for their use. Call your doctor or health care professional for advice if you get a fever, chills or sore throat, or other symptoms of a cold or flu. Do not treat yourself. This drug decreases your body's ability to fight infections. Try to avoid being around people who are sick. This medicine may increase your risk to bruise or bleed. Call your doctor or health care professional if you notice any unusual bleeding. Be careful brushing and flossing your teeth or using a toothpick because you may get an infection or bleed more easily. If you  have any dental work done, tell your dentist you are receiving this medicine. Avoid taking products that contain aspirin, acetaminophen, ibuprofen, naproxen, or ketoprofen unless instructed by your doctor. These medicines may hide a fever. Women should inform their doctor if they wish to become pregnant or think they might be pregnant. There is a potential for serious side effects to an unborn child. Talk to your health care professional or pharmacist for more information. Do not breast-feed an infant while taking this medicine. What side effects may I notice from receiving this medicine? Side effects that you should report to your doctor or health care professional as soon as possible: -allergic reactions like skin rash, itching or hives, swelling of the face, lips, or tongue -low blood counts - this medicine may decrease the number of white blood cells, red blood cells and platelets. You may be at increased risk for infections and bleeding. -signs of infection - fever or chills, cough, sore throat, pain or difficulty passing urine -signs of decreased platelets or bleeding - bruising, pinpoint red spots on the skin, black, tarry stools, blood in the urine -signs of decreased red blood cells - unusually weak or tired, fainting spells, lightheadedness -breathing problems -chest pain -mouth sores -nausea and vomiting -pain, swelling, redness at site where injected -pain, tingling, numbness in the hands or feet -stomach pain -swelling of ankles, feet,  hands -unusual bleeding Side effects that usually do not require medical attention (report to your doctor or health care professional if they continue or are bothersome): -constipation -diarrhea -hair loss -loss of appetite -stomach upset This list may not describe all possible side effects. Call your doctor for medical advice about side effects. You may report side effects to FDA at 1-800-FDA-1088. Where should I keep my medicine? This drug is  given in a hospital or clinic and will not be stored at home. NOTE: This sheet is a summary. It may not cover all possible information. If you have questions about this medicine, talk to your doctor, pharmacist, or health care provider.    2016, Elsevier/Gold Standard. (2007-12-13 18:45:54)   I .

## 2015-11-06 ENCOUNTER — Telehealth: Payer: Self-pay | Admitting: *Deleted

## 2015-11-06 NOTE — Telephone Encounter (Signed)
Wife called back to report he is sleeping more today, but gets up to void. Ate bowl of cereal and drank an Ensure today. Pain is at level 1-2/10. His sons want to take him to [facility] for 1 night over the weekend. Instructed wife to be sure to take contact information for [facility] with them as well as his last AVS that tells what tx he had in case they need an ER visit. Be sure to have thermometer and know where the ER is in [facility]. He is high risk for blood clots, so be sure to push fluids and have him get out and stretch frequently.

## 2015-11-06 NOTE — Telephone Encounter (Signed)
  Oncology Nurse Navigator Documentation  Navigator Location: [facility] (11/06/15 1330) Navigator Encounter Type: Telephone (11/06/15 1330) Telephone: Outgoing Call;Patient Update (11/06/15 1330)  Son, Merry Proud reports Roger Vaughn is sleeping a lot--possibly more than his baseline. Has had no N/V. Ate a little breakfast and went back to bed. He will have wife call back.

## 2015-11-11 ENCOUNTER — Other Ambulatory Visit: Payer: Medicare Other

## 2015-11-11 ENCOUNTER — Other Ambulatory Visit (HOSPITAL_BASED_OUTPATIENT_CLINIC_OR_DEPARTMENT_OTHER): Payer: Medicare Other

## 2015-11-11 ENCOUNTER — Encounter: Payer: Medicare Other | Admitting: Nutrition

## 2015-11-11 ENCOUNTER — Telehealth: Payer: Self-pay | Admitting: Nurse Practitioner

## 2015-11-11 ENCOUNTER — Ambulatory Visit: Payer: Medicare Other

## 2015-11-11 ENCOUNTER — Ambulatory Visit: Payer: Medicare Other | Admitting: Oncology

## 2015-11-11 ENCOUNTER — Ambulatory Visit (HOSPITAL_COMMUNITY)
Admission: RE | Admit: 2015-11-11 | Discharge: 2015-11-11 | Disposition: A | Discharge status: Home / Self Care | Payer: Medicare Other | Source: Ambulatory Visit | Attending: Oncology | Admitting: Oncology

## 2015-11-11 ENCOUNTER — Encounter: Payer: Self-pay | Admitting: *Deleted

## 2015-11-11 ENCOUNTER — Ambulatory Visit (HOSPITAL_BASED_OUTPATIENT_CLINIC_OR_DEPARTMENT_OTHER): Payer: Medicare Other | Admitting: Nurse Practitioner

## 2015-11-11 ENCOUNTER — Encounter: Payer: Self-pay | Admitting: Nurse Practitioner

## 2015-11-11 VITALS — BP 91/60 | HR 92 | Temp 98.2°F | Resp 18 | Ht 67.5 in | Wt 110.8 lb

## 2015-11-11 DIAGNOSIS — D649 Anemia, unspecified: Secondary | ICD-10-CM | POA: Diagnosis not present

## 2015-11-11 DIAGNOSIS — C787 Secondary malignant neoplasm of liver and intrahepatic bile duct: Secondary | ICD-10-CM

## 2015-11-11 DIAGNOSIS — R109 Unspecified abdominal pain: Secondary | ICD-10-CM

## 2015-11-11 DIAGNOSIS — C250 Malignant neoplasm of head of pancreas: Secondary | ICD-10-CM | POA: Diagnosis not present

## 2015-11-11 DIAGNOSIS — D696 Thrombocytopenia, unspecified: Secondary | ICD-10-CM | POA: Diagnosis not present

## 2015-11-11 DIAGNOSIS — R0609 Other forms of dyspnea: Secondary | ICD-10-CM

## 2015-11-11 LAB — CBC WITH DIFFERENTIAL/PLATELET
BASO%: 0.5 % (ref 0.0–2.0)
Basophils Absolute: 0 10*3/uL (ref 0.0–0.1)
EOS%: 1 % (ref 0.0–7.0)
Eosinophils Absolute: 0.1 10*3/uL (ref 0.0–0.5)
HCT: 22 % — ABNORMAL LOW (ref 38.4–49.9)
HEMOGLOBIN: 7.3 g/dL — AB (ref 13.0–17.1)
LYMPH#: 1.2 10*3/uL (ref 0.9–3.3)
LYMPH%: 16 % (ref 14.0–49.0)
MCH: 29.2 pg (ref 27.2–33.4)
MCHC: 33.2 g/dL (ref 32.0–36.0)
MCV: 88 fL (ref 79.3–98.0)
MONO#: 0.5 10*3/uL (ref 0.1–0.9)
MONO%: 6.4 % (ref 0.0–14.0)
NEUT%: 76.1 % — AB (ref 39.0–75.0)
NEUTROS ABS: 5.8 10*3/uL (ref 1.5–6.5)
Platelets: 73 10*3/uL — ABNORMAL LOW (ref 140–400)
RBC: 2.5 10*6/uL — ABNORMAL LOW (ref 4.20–5.82)
RDW: 15.9 % — AB (ref 11.0–14.6)
WBC: 7.6 10*3/uL (ref 4.0–10.3)
nRBC: 0 % (ref 0–0)

## 2015-11-11 LAB — ABO/RH: ABO/RH(D): A NEG

## 2015-11-11 LAB — COMPREHENSIVE METABOLIC PANEL
ALBUMIN: 3.1 g/dL — AB (ref 3.5–5.0)
ALK PHOS: 176 U/L — AB (ref 40–150)
ALT: 40 U/L (ref 0–55)
AST: 39 U/L — AB (ref 5–34)
Anion Gap: 11 mEq/L (ref 3–11)
BUN: 21.4 mg/dL (ref 7.0–26.0)
CHLORIDE: 104 meq/L (ref 98–109)
CO2: 24 mEq/L (ref 22–29)
Calcium: 9.1 mg/dL (ref 8.4–10.4)
Creatinine: 1 mg/dL (ref 0.7–1.3)
EGFR: 72 mL/min/{1.73_m2} — ABNORMAL LOW (ref >90)
GLUCOSE: 228 mg/dL — AB (ref 70–140)
POTASSIUM: 4.2 meq/L (ref 3.5–5.1)
SODIUM: 140 meq/L (ref 136–145)
Total Bilirubin: 2.02 mg/dL — ABNORMAL HIGH (ref 0.20–1.20)
Total Protein: 6 g/dL — ABNORMAL LOW (ref 6.4–8.3)

## 2015-11-11 LAB — PREPARE RBC (CROSSMATCH)

## 2015-11-11 LAB — TECHNOLOGIST REVIEW

## 2015-11-11 NOTE — Telephone Encounter (Signed)
oper 2nd pof to sch pt appt-cld & spoke to wife and adv of 8:00 3/28 lab-psen MWQ email to get updated copy of sch 3/2

## 2015-11-11 NOTE — Progress Notes (Signed)
Oncology Nurse Navigator Documentation  Oncology Nurse Navigator Flowsheets 11/11/2015  Navigator Location [facility]  Navigator Encounter Type Follow-up Appt  Telephone -  Abnormal Finding Date -  Confirmed Diagnosis Date -  Treatment Initiated Date -  Patient Visit Type MedOnc  Treatment Phase Active Tx  Barriers/Navigation Needs Family concerns  Education Pain/ Symptom Management--take pain med when you are in pain; ambulate w/assistance-use walker   Interventions Education Method  Referrals Nutrition/dietician--will request he be seen tomorrow when in infusion area-taste alterations  Coordination of Care -  Education Method Verbal;Teach-back  Support Groups/Services -  Acuity -  Time Spent with Patient -15  Wife reports he has a walker ordered now. Is very weak-agrees to transfusion tomorrow. Not eating, drinking supplements only. Denies any nausea-just does not eat due to no appetite and taste alterations. Will discuss palliative team referral when he is getting transfusion tomorrow (OK per Dr. Benay Spice). Wife reports to come to appointment today, he sat on stairs to go down and then needed his son to get him up when he reached the bottom.

## 2015-11-11 NOTE — Telephone Encounter (Signed)
per pof to schpt appt-sent back to lab-gave pt copy of avs °

## 2015-11-11 NOTE — Progress Notes (Signed)
  [place] OFFICE PROGRESS NOTE   Diagnosis:  Pancreas cancer  INTERVAL HISTORY:   Roger Vaughn returns as scheduled. He completed cycle 1 gemcitabine/Abraxane 11/05/2015. He denies nausea/vomiting. No mouth sores. He has had a few loose stools. His wife thinks the loose stools are related to his laxative. He reports abdominal pain is overall well-controlled. Appetite is poor. He denies any bleeding. No black stools. He reports dyspnea on exertion. His legs feel weak.  Objective:  Vital signs in last 24 hours:  Blood pressure 91/60, pulse 92, temperature 98.2 F (36.8 C), temperature source Oral, resp. rate 18, height 5' 7.5" (1.715 m), weight 110 lb 12.8 oz (50.259 kg), SpO2 100 %.    HEENT: No thrush or ulcers. Resp: Lungs clear bilaterally. Cardio: Regular rate and rhythm. GI: Abdomen nontender. No hepatomegaly. No mass. Vascular: No leg edema. Neuro: Lower extremity motor strength 5 over 5. Skin: Mild decrease in skin turgor.    Lab Results:  Lab Results  Component Value Date   WBC 7.6 11/11/2015   HGB 7.3* 11/11/2015   HCT 22.0* 11/11/2015   MCV 88.0 11/11/2015   PLT 73* 11/11/2015   NEUTROABS 5.8 11/11/2015    Imaging:  No results found.  Medications: I have reviewed the patient's current medications.  Assessment/Plan: 1. Pancreas cancer presenting with abdominal pain and jaundice  CT abdomen/pelvis 09/25/2015 with a mass in the head/uncinate process of the pancreas; diffuse biliary ductal dilatation secondary to obstruction of the central common bile duct at the head of the pancreas; multiple small hepatic hypodense lesions.  10/03/2015 upper EUS with findings of a 20 mm x 25 mm round hypoechoic mass in the head of the pancreas with upstream pancreatic and biliary ductal dilatation. The mass was noted to abut the portal vein at the confluence and invade the SMV for several centimeters. No obvious involvement of the celiac artery or SMA. No  obvious peripancreatic adenopathy. Multiple biopsies were obtained. Pathology showed malignant cells consistent with adenocarcinoma.   10/03/2015 ERCP with stent placement.  10/18/2015 MRI abdomen with widespread metastatic disease to the liver, large pancreatic head mass causing pancreatic ductal obstruction and probable malignant lymphadenopathy in the portacaval nodal station.  Cycle 1 gemcitabine/Abraxane 11/05/2015 2. Abdominal pain secondary to #1 3. Obstructive jaundice secondary to #1 status post stent placement 10/03/2015 4. Nausea 5. Glaucoma 6. Altered mental status 10/28/2015. Long-acting pain medication and Remeron discontinued. Mental status improved 10/31/2015. 7. Anemia and thrombocytopenia 11/11/2015. Likely secondary to chemotherapy.    Disposition: Roger Vaughn appears unchanged. He continues to have a poor performance status. He has completed 1 cycle of gemcitabine/Abraxane. The plan is to continue gemcitabine/Abraxane on a 2 week schedule.  He has progressive anemia and thrombocytopenia likely secondary to chemotherapy. There is no clinical evidence of bleeding. He is symptomatic from the anemia. He will return for a blood transfusion and repeat CBC on 11/12/2015.   He will return for a follow-up visit and cycle 2 gemcitabine/Abraxane on 11/18/2015. He will contact the office in the interim with any problems.  Patient seen with Dr. Benay Spice.   [name] ANP/GNP-BC   11/11/2015  3:51 PM  this was a shared visit with [name]. Roger Vaughn appears stable compared to prechemotherapy. He has developed anemia and thrombocytopenia. He will be transfused red blood cells tomorrow. We will check a platelet count 11/12/2015.   [name], M.D.

## 2015-11-12 ENCOUNTER — Other Ambulatory Visit (HOSPITAL_BASED_OUTPATIENT_CLINIC_OR_DEPARTMENT_OTHER): Payer: Medicare Other

## 2015-11-12 ENCOUNTER — Ambulatory Visit (HOSPITAL_BASED_OUTPATIENT_CLINIC_OR_DEPARTMENT_OTHER): Payer: Medicare Other

## 2015-11-12 ENCOUNTER — Other Ambulatory Visit: Payer: Self-pay | Admitting: *Deleted

## 2015-11-12 VITALS — BP 134/71 | HR 68 | Temp 98.8°F | Resp 18

## 2015-11-12 DIAGNOSIS — C787 Secondary malignant neoplasm of liver and intrahepatic bile duct: Secondary | ICD-10-CM

## 2015-11-12 DIAGNOSIS — D649 Anemia, unspecified: Secondary | ICD-10-CM | POA: Diagnosis not present

## 2015-11-12 DIAGNOSIS — C250 Malignant neoplasm of head of pancreas: Secondary | ICD-10-CM

## 2015-11-12 LAB — CBC WITH DIFFERENTIAL/PLATELET
BASO%: 0.5 % (ref 0.0–2.0)
BASOS ABS: 0.1 10*3/uL (ref 0.0–0.1)
EOS ABS: 0.2 10*3/uL (ref 0.0–0.5)
EOS%: 2.3 % (ref 0.0–7.0)
HEMATOCRIT: 22.8 % — AB (ref 38.4–49.9)
HGB: 7.5 g/dL — ABNORMAL LOW (ref 13.0–17.1)
LYMPH#: 1.4 10*3/uL (ref 0.9–3.3)
LYMPH%: 13.4 % — ABNORMAL LOW (ref 14.0–49.0)
MCH: 29.5 pg (ref 27.2–33.4)
MCHC: 33 g/dL (ref 32.0–36.0)
MCV: 89.5 fL (ref 79.3–98.0)
MONO#: 0.6 10*3/uL (ref 0.1–0.9)
MONO%: 5.3 % (ref 0.0–14.0)
NEUT#: 8.4 10*3/uL — ABNORMAL HIGH (ref 1.5–6.5)
NEUT%: 78.5 % — ABNORMAL HIGH (ref 39.0–75.0)
Platelets: 89 10*3/uL — ABNORMAL LOW (ref 140–400)
RBC: 2.54 10*6/uL — ABNORMAL LOW (ref 4.20–5.82)
RDW: 16.2 % — AB (ref 11.0–14.6)
WBC: 10.7 10*3/uL — ABNORMAL HIGH (ref 4.0–10.3)

## 2015-11-12 LAB — PREPARE RBC (CROSSMATCH)

## 2015-11-12 LAB — TECHNOLOGIST REVIEW: Technologist Review: 1

## 2015-11-12 MED ORDER — SODIUM CHLORIDE 0.9 % IV SOLN
250.0000 mL | Freq: Once | INTRAVENOUS | Status: AC
  Administered 2015-11-12: 250 mL via INTRAVENOUS

## 2015-11-12 NOTE — Patient Instructions (Signed)

## 2015-11-13 LAB — TYPE AND SCREEN
ABO/RH(D): A NEG
Antibody Screen: NEGATIVE
UNIT DIVISION: 0
Unit division: 0

## 2015-11-18 ENCOUNTER — Telehealth: Payer: Self-pay | Admitting: Oncology

## 2015-11-18 ENCOUNTER — Ambulatory Visit: Payer: Medicare Other | Admitting: Nutrition

## 2015-11-18 ENCOUNTER — Ambulatory Visit (HOSPITAL_BASED_OUTPATIENT_CLINIC_OR_DEPARTMENT_OTHER): Payer: Medicare Other | Admitting: Nurse Practitioner

## 2015-11-18 ENCOUNTER — Ambulatory Visit (HOSPITAL_BASED_OUTPATIENT_CLINIC_OR_DEPARTMENT_OTHER): Payer: Medicare Other

## 2015-11-18 ENCOUNTER — Other Ambulatory Visit (HOSPITAL_BASED_OUTPATIENT_CLINIC_OR_DEPARTMENT_OTHER): Payer: Medicare Other

## 2015-11-18 ENCOUNTER — Encounter: Payer: Self-pay | Admitting: *Deleted

## 2015-11-18 VITALS — HR 92 | Resp 18

## 2015-11-18 VITALS — BP 96/63 | HR 108 | Temp 98.6°F | Resp 18 | Ht 67.5 in | Wt 110.6 lb

## 2015-11-18 DIAGNOSIS — C20 Malignant neoplasm of rectum: Secondary | ICD-10-CM

## 2015-11-18 DIAGNOSIS — C787 Secondary malignant neoplasm of liver and intrahepatic bile duct: Secondary | ICD-10-CM

## 2015-11-18 DIAGNOSIS — D649 Anemia, unspecified: Secondary | ICD-10-CM | POA: Diagnosis not present

## 2015-11-18 DIAGNOSIS — Z5111 Encounter for antineoplastic chemotherapy: Secondary | ICD-10-CM

## 2015-11-18 DIAGNOSIS — G893 Neoplasm related pain (acute) (chronic): Secondary | ICD-10-CM

## 2015-11-18 DIAGNOSIS — C250 Malignant neoplasm of head of pancreas: Secondary | ICD-10-CM | POA: Diagnosis not present

## 2015-11-18 DIAGNOSIS — D696 Thrombocytopenia, unspecified: Secondary | ICD-10-CM

## 2015-11-18 LAB — CBC WITH DIFFERENTIAL/PLATELET
BASO%: 1.4 % (ref 0.0–2.0)
BASOS ABS: 0.1 10*3/uL (ref 0.0–0.1)
EOS ABS: 0.2 10*3/uL (ref 0.0–0.5)
EOS%: 1.8 % (ref 0.0–7.0)
HEMATOCRIT: 33.6 % — AB (ref 38.4–49.9)
HEMOGLOBIN: 10.9 g/dL — AB (ref 13.0–17.1)
LYMPH#: 1 10*3/uL (ref 0.9–3.3)
LYMPH%: 9.6 % — ABNORMAL LOW (ref 14.0–49.0)
MCH: 29 pg (ref 27.2–33.4)
MCHC: 32.3 g/dL (ref 32.0–36.0)
MCV: 89.8 fL (ref 79.3–98.0)
MONO#: 0.9 10*3/uL (ref 0.1–0.9)
MONO%: 8.9 % (ref 0.0–14.0)
NEUT#: 7.9 10*3/uL — ABNORMAL HIGH (ref 1.5–6.5)
NEUT%: 78.3 % — AB (ref 39.0–75.0)
PLATELETS: 194 10*3/uL (ref 140–400)
RBC: 3.75 10*6/uL — ABNORMAL LOW (ref 4.20–5.82)
RDW: 18.2 % — AB (ref 11.0–14.6)
WBC: 10.1 10*3/uL (ref 4.0–10.3)

## 2015-11-18 LAB — COMPREHENSIVE METABOLIC PANEL
ALBUMIN: 3.2 g/dL — AB (ref 3.5–5.0)
ALK PHOS: 217 U/L — AB (ref 40–150)
ALT: 32 U/L (ref 0–55)
ANION GAP: 11 meq/L (ref 3–11)
AST: 38 U/L — AB (ref 5–34)
BUN: 18.5 mg/dL (ref 7.0–26.0)
CALCIUM: 9.2 mg/dL (ref 8.4–10.4)
CHLORIDE: 103 meq/L (ref 98–109)
CO2: 26 mEq/L (ref 22–29)
CREATININE: 1 mg/dL (ref 0.7–1.3)
EGFR: 74 mL/min/{1.73_m2} — ABNORMAL LOW (ref >90)
Glucose: 211 mg/dl — ABNORMAL HIGH (ref 70–140)
POTASSIUM: 4.2 meq/L (ref 3.5–5.1)
Sodium: 140 mEq/L (ref 136–145)
Total Bilirubin: 1.94 mg/dL — ABNORMAL HIGH (ref 0.20–1.20)
Total Protein: 6.2 g/dL — ABNORMAL LOW (ref 6.4–8.3)

## 2015-11-18 MED ORDER — MIRTAZAPINE 15 MG PO TABS: 15.0000 mg | ORAL_TABLET | Freq: Every day | ORAL | Status: AC

## 2015-11-18 MED ORDER — PACLITAXEL PROTEIN-BOUND CHEMO INJECTION 100 MG
75.0000 mg/m2 | Freq: Once | INTRAVENOUS | Status: AC
  Administered 2015-11-18: 125 mg via INTRAVENOUS
  Filled 2015-11-18: qty 25

## 2015-11-18 MED ORDER — SODIUM CHLORIDE 0.9 % IV SOLN
800.0000 mg/m2 | Freq: Once | INTRAVENOUS | Status: AC
  Administered 2015-11-18: 1254 mg via INTRAVENOUS
  Filled 2015-11-18: qty 32.98

## 2015-11-18 MED ORDER — SODIUM CHLORIDE 0.9 % IV SOLN
Freq: Once | INTRAVENOUS | Status: AC
  Administered 2015-11-18: 13:00:00 via INTRAVENOUS
  Filled 2015-11-18: qty 4

## 2015-11-18 MED ORDER — SODIUM CHLORIDE 0.9 % IV SOLN
Freq: Once | INTRAVENOUS | Status: AC
  Administered 2015-11-18: 13:00:00 via INTRAVENOUS

## 2015-11-18 MED ORDER — OXYCODONE HCL 5 MG PO TABS: 5.0000 mg | ORAL_TABLET | ORAL | Status: DC | PRN

## 2015-11-18 NOTE — Patient Instructions (Signed)
[facility] Discharge Instructions for Patients Receiving Chemotherapy  Today you received the following chemotherapy agents: Abraxane and Gemzar.  To help prevent nausea and vomiting after your treatment, we encourage you to take your nausea medication: Compazine 5 mg every 6 hours as needed.   If you develop nausea and vomiting that is not controlled by your nausea medication, call the clinic.   BELOW ARE SYMPTOMS THAT SHOULD BE REPORTED IMMEDIATELY:  *FEVER GREATER THAN 100.5 F  *CHILLS WITH OR WITHOUT FEVER  NAUSEA AND VOMITING THAT IS NOT CONTROLLED WITH YOUR NAUSEA MEDICATION  *UNUSUAL SHORTNESS OF BREATH  *UNUSUAL BRUISING OR BLEEDING  TENDERNESS IN MOUTH AND THROAT WITH OR WITHOUT PRESENCE OF ULCERS  *URINARY PROBLEMS  *BOWEL PROBLEMS  UNUSUAL RASH Items with * indicate a potential emergency and should be followed up as soon as possible.  Feel free to call the clinic you have any questions or concerns. The clinic phone number is [phone].  Please show the [place] at check-in to the Emergency Department and triage nurse.

## 2015-11-18 NOTE — Telephone Encounter (Signed)
Gave and printed appt shced and avs for pt for April adn May

## 2015-11-18 NOTE — Progress Notes (Signed)
Nutrition follow up completed with patient in infusion for cancer of the pancreas. Patient states he is feeling well.  States he thinks he has been eating more. Weight decreased at 110.6 pounds down from 112.6 pounds on March 21. Patient feels weak. States he sleeps a lot. Reports drinking 2-3 oral nutrition supplements daily.  Nutrition Diagnosis:   Inadequate oral intake continues.  Intervention: Encouraged patient to continue small frequent meals with snacks consisting of high calorie, high protein foods. Recommended patient increase oral nutrition supplements to QID. Teach back method used.  Provided support and encouragement.  Monitoring, Evaluation, Goals:   Patient will increase calories and protein to promote weight gain.  Next Visit:Monday, May 1, in infusion.

## 2015-11-18 NOTE — Progress Notes (Addendum)
  [facility] OFFICE PROGRESS NOTE   Diagnosis:  Pancreas cancer  INTERVAL HISTORY:   Roger Vaughn returns as scheduled. He completed cycle 1 gemcitabine/Abraxane 11/05/2015. He was transfused 2 units of blood on 11/12/2015. He felt better following the blood transfusion. Today he feels "weak". No nausea or vomiting. Oral intake is "a little better". Pain is controlled with 3 pain pills a day. Bowels moving.  Objective:  Vital signs in last 24 hours:  Blood pressure 96/63, pulse 108, temperature 98.6 F (37 C), temperature source Oral, resp. rate 18, height 5' 7.5" (1.715 m), weight 110 lb 9.6 oz (50.168 kg), SpO2 100 %.    HEENT: No thrush or ulcers. Resp: Lungs clear bilaterally. Cardio: Regular rate and rhythm. GI: Abdomen soft. No hepatomegaly. Vascular: No leg edema. Skin: No rash. Port-A-Cath without erythema.    Lab Results:  Lab Results  Component Value Date   WBC 10.1 11/18/2015   HGB 10.9* 11/18/2015   HCT 33.6* 11/18/2015   MCV 89.8 11/18/2015   PLT 194 11/18/2015   NEUTROABS 7.9* 11/18/2015    Imaging:  No results found.  Medications: I have reviewed the patient's current medications.  Assessment/Plan: 1. Pancreas cancer presenting with abdominal pain and jaundice  CT abdomen/pelvis 09/25/2015 with a mass in the head/uncinate process of the pancreas; diffuse biliary ductal dilatation secondary to obstruction of the central common bile duct at the head of the pancreas; multiple small hepatic hypodense lesions.  10/03/2015 upper EUS with findings of a 20 mm x 25 mm round hypoechoic mass in the head of the pancreas with upstream pancreatic and biliary ductal dilatation. The mass was noted to abut the portal vein at the confluence and invade the SMV for several centimeters. No obvious involvement of the celiac artery or SMA. No obvious peripancreatic adenopathy. Multiple biopsies were obtained. Pathology showed malignant cells consistent with  adenocarcinoma.   10/03/2015 ERCP with stent placement.  10/18/2015 MRI abdomen with widespread metastatic disease to the liver, large pancreatic head mass causing pancreatic ductal obstruction and probable malignant lymphadenopathy in the portacaval nodal station.  Cycle 1 gemcitabine/Abraxane 11/05/2015  Cycle 2 gemcitabine/Abraxane 11/18/2015 2. Abdominal pain secondary to #1 3. Obstructive jaundice secondary to #1 status post stent placement 10/03/2015 4. Nausea 5. Glaucoma 6. Altered mental status 10/28/2015. Long-acting pain medication and Remeron discontinued. Mental status improved 10/31/2015. 7. Anemia and thrombocytopenia 11/11/2015. Likely secondary to chemotherapy. He was transfused 2 units of blood 11/12/2015.   Disposition: Roger Vaughn appears improved. The plan is to proceed with cycle 2 gemcitabine/Abraxane today as scheduled. He will return for a follow-up visit and cycle 3 in 2 weeks. He will contact the office in the interim with any problems.  Patient seen with Dr. Benay Spice.    [name] ANP/GNP-BC   11/18/2015  12:25 PM   this was a shared visit with [name]. Roger Vaughn tolerated the first cycle of chemotherapy well. The plan is to proceed with cycle 2 today. He will return for an office visit in 2 weeks.   [name], M.D.

## 2015-11-18 NOTE — Progress Notes (Signed)
Oncology Nurse Navigator Documentation  Oncology Nurse Navigator Flowsheets 11/18/2015  Navigator Location [facility]  Navigator Encounter Type Treatment  Telephone -  Treatment Initiated Date -  Patient Visit Type MedOnc  Treatment Phase Active Tx--tx #2   Barriers/Navigation Needs Education  Education Pain/ Symptom Management--anorexia  Interventions Education Method  Referrals -  Coordination of Care -  Education Method Verbal--push fluids, get up and walk every 2 hours during the day to get stronger.  Support Groups/Services -  Acuity Level 1  Time Spent with Patient [place] seemed a bit more alert today and conversed better with nurse. Describes the activity in the home this past couple weeks with his sons helping fix house to get ready to put on market. His wife has found someone to take care of the lawn for them. Reports the master bedroom is on main floor of house. Weight is stable this week.

## 2015-11-18 NOTE — Progress Notes (Signed)
OK to treat with Bili 63f 1.94 per [name], NP

## 2015-12-01 ENCOUNTER — Other Ambulatory Visit: Payer: Self-pay | Admitting: Oncology

## 2015-12-02 ENCOUNTER — Ambulatory Visit (HOSPITAL_BASED_OUTPATIENT_CLINIC_OR_DEPARTMENT_OTHER): Payer: Medicare Other

## 2015-12-02 ENCOUNTER — Other Ambulatory Visit: Payer: Self-pay | Admitting: *Deleted

## 2015-12-02 ENCOUNTER — Encounter: Payer: Self-pay | Admitting: Oncology

## 2015-12-02 ENCOUNTER — Ambulatory Visit (HOSPITAL_BASED_OUTPATIENT_CLINIC_OR_DEPARTMENT_OTHER): Payer: Medicare Other | Admitting: Oncology

## 2015-12-02 ENCOUNTER — Ambulatory Visit: Payer: Medicare Other | Admitting: Nutrition

## 2015-12-02 ENCOUNTER — Other Ambulatory Visit (HOSPITAL_BASED_OUTPATIENT_CLINIC_OR_DEPARTMENT_OTHER): Payer: Medicare Other

## 2015-12-02 VITALS — BP 119/66 | HR 68 | Resp 20

## 2015-12-02 VITALS — BP 92/60 | HR 105 | Temp 97.5°F | Resp 16 | Ht 67.5 in | Wt 108.5 lb

## 2015-12-02 DIAGNOSIS — C250 Malignant neoplasm of head of pancreas: Secondary | ICD-10-CM | POA: Diagnosis not present

## 2015-12-02 DIAGNOSIS — Z5111 Encounter for antineoplastic chemotherapy: Secondary | ICD-10-CM | POA: Diagnosis not present

## 2015-12-02 DIAGNOSIS — G893 Neoplasm related pain (acute) (chronic): Secondary | ICD-10-CM | POA: Diagnosis not present

## 2015-12-02 DIAGNOSIS — C787 Secondary malignant neoplasm of liver and intrahepatic bile duct: Secondary | ICD-10-CM

## 2015-12-02 DIAGNOSIS — D649 Anemia, unspecified: Secondary | ICD-10-CM

## 2015-12-02 LAB — COMPREHENSIVE METABOLIC PANEL
ALT: 26 U/L (ref 0–55)
AST: 32 U/L (ref 5–34)
Albumin: 3.2 g/dL — ABNORMAL LOW (ref 3.5–5.0)
Alkaline Phosphatase: 238 U/L — ABNORMAL HIGH (ref 40–150)
Anion Gap: 12 mEq/L — ABNORMAL HIGH (ref 3–11)
BUN: 18.7 mg/dL (ref 7.0–26.0)
CALCIUM: 9.1 mg/dL (ref 8.4–10.4)
CHLORIDE: 103 meq/L (ref 98–109)
CO2: 24 meq/L (ref 22–29)
CREATININE: 0.9 mg/dL (ref 0.7–1.3)
EGFR: 80 mL/min/{1.73_m2} — ABNORMAL LOW (ref >90)
Glucose: 205 mg/dl — ABNORMAL HIGH (ref 70–140)
Potassium: 4 mEq/L (ref 3.5–5.1)
Sodium: 139 mEq/L (ref 136–145)
Total Bilirubin: 1.32 mg/dL — ABNORMAL HIGH (ref 0.20–1.20)
Total Protein: 6.2 g/dL — ABNORMAL LOW (ref 6.4–8.3)

## 2015-12-02 LAB — CBC WITH DIFFERENTIAL/PLATELET
BASO%: 0.6 % (ref 0.0–2.0)
Basophils Absolute: 0.1 10*3/uL (ref 0.0–0.1)
EOS%: 1.4 % (ref 0.0–7.0)
Eosinophils Absolute: 0.1 10*3/uL (ref 0.0–0.5)
HCT: 29.8 % — ABNORMAL LOW (ref 38.4–49.9)
HGB: 9.7 g/dL — ABNORMAL LOW (ref 13.0–17.1)
LYMPH%: 11.9 % — AB (ref 14.0–49.0)
MCH: 29.9 pg (ref 27.2–33.4)
MCHC: 32.5 g/dL (ref 32.0–36.0)
MCV: 92 fL (ref 79.3–98.0)
MONO#: 1.1 10*3/uL — ABNORMAL HIGH (ref 0.1–0.9)
MONO%: 12.4 % (ref 0.0–14.0)
NEUT#: 6.4 10*3/uL (ref 1.5–6.5)
NEUT%: 73.7 % (ref 39.0–75.0)
Platelets: 228 10*3/uL (ref 140–400)
RBC: 3.24 10*6/uL — AB (ref 4.20–5.82)
RDW: 19.9 % — ABNORMAL HIGH (ref 11.0–14.6)
WBC: 8.6 10*3/uL (ref 4.0–10.3)
lymph#: 1 10*3/uL (ref 0.9–3.3)

## 2015-12-02 MED ORDER — SODIUM CHLORIDE 0.9 % IV SOLN
Freq: Once | INTRAVENOUS | Status: AC
  Administered 2015-12-02: 11:00:00 via INTRAVENOUS
  Filled 2015-12-02: qty 4

## 2015-12-02 MED ORDER — SODIUM CHLORIDE 0.9 % IV SOLN
800.0000 mg/m2 | Freq: Once | INTRAVENOUS | Status: AC
  Administered 2015-12-02: 1254 mg via INTRAVENOUS
  Filled 2015-12-02: qty 32.98

## 2015-12-02 MED ORDER — SODIUM CHLORIDE 0.9 % IV SOLN
Freq: Once | INTRAVENOUS | Status: AC
  Administered 2015-12-02: 11:00:00 via INTRAVENOUS

## 2015-12-02 MED ORDER — OXYCODONE HCL 5 MG PO TABS: 5.0000 mg | ORAL_TABLET | ORAL | Status: DC | PRN

## 2015-12-02 MED ORDER — PACLITAXEL PROTEIN-BOUND CHEMO INJECTION 100 MG
75.0000 mg/m2 | Freq: Once | INTRAVENOUS | Status: AC
  Administered 2015-12-02: 125 mg via INTRAVENOUS
  Filled 2015-12-02: qty 25

## 2015-12-02 NOTE — Telephone Encounter (Signed)
Call from infusion RN reporting pt has about 25 Oxycodone tablets remaining. Will need refill before next visit. Requesting refill today. Reviewed with [name], NP. Rx refilled.

## 2015-12-02 NOTE — Progress Notes (Signed)
Nutrition follow up completed with patient in infusion for cancer of the pancreas. Patient states he cannot eat but he can drink Ensure Plus. Weight decreased at 108 pounds down from 110.6 pounds  Patient feels weak. States he sleeps a lot. Reports drinking at least 3 oral nutrition supplements daily.  Nutrition Diagnosis:  Inadequate oral intake continues.  Intervention: Encouraged patient to continue small frequent meals with snacks consisting of high calorie, high protein foods as tolerated. Recommended patient increase oral nutrition supplements to QID. Provided one complimentary case of Ensure Plus. Teach back method used. Provided support and encouragement.  Monitoring, Evaluation, Goals:  Patient will increase calories and protein to minimize weight loss.  Next Visit: Monday, May 1, in infusion.

## 2015-12-02 NOTE — Progress Notes (Signed)
  [facility] OFFICE PROGRESS NOTE   Diagnosis:  Pancreas cancer  INTERVAL HISTORY:   Roger Vaughn returns as scheduled. He completed cycle 1 gemcitabine/Abraxane 11/18/2015. Today he feels "weak". No nausea or vomiting. Oral intake is fair. Pain is controlled with 10-12 pain pills a day. Sleeping more. Bowels moving.  Objective:  Vital signs in last 24 hours:  Blood pressure 92/60, pulse 105, temperature 97.5 F (36.4 C), temperature source Oral, resp. rate 16, height 5' 7.5" (1.715 m), weight 108 lb 8 oz (49.215 kg), SpO2 100 %.    HEENT: No thrush or ulcers. Resp: Lungs clear bilaterally. Cardio: Regular rate and rhythm. GI: Abdomen soft. No hepatomegaly. Vascular: No leg edema. Skin: No rash. Port-A-Cath without erythema.    Lab Results:  Lab Results  Component Value Date   WBC 8.6 12/02/2015   HGB 9.7* 12/02/2015   HCT 29.8* 12/02/2015   MCV 92.0 12/02/2015   PLT 228 12/02/2015   NEUTROABS 6.4 12/02/2015    Imaging:  No results found.  Medications: I have reviewed the patient's current medications.  Assessment/Plan: 1. Pancreas cancer presenting with abdominal pain and jaundice  CT abdomen/pelvis 09/25/2015 with a mass in the head/uncinate process of the pancreas; diffuse biliary ductal dilatation secondary to obstruction of the central common bile duct at the head of the pancreas; multiple small hepatic hypodense lesions.  10/03/2015 upper EUS with findings of a 20 mm x 25 mm round hypoechoic mass in the head of the pancreas with upstream pancreatic and biliary ductal dilatation. The mass was noted to abut the portal vein at the confluence and invade the SMV for several centimeters. No obvious involvement of the celiac artery or SMA. No obvious peripancreatic adenopathy. Multiple biopsies were obtained. Pathology showed malignant cells consistent with adenocarcinoma.   10/03/2015 ERCP with stent placement.  10/18/2015 MRI abdomen with  widespread metastatic disease to the liver, large pancreatic head mass causing pancreatic ductal obstruction and probable malignant lymphadenopathy in the portacaval nodal station.  Cycle 1 gemcitabine/Abraxane 11/05/2015  Cycle 2 gemcitabine/Abraxane 11/18/2015  Cycle 3 gemcitabine/Abraxane 12/02/2015 2. Abdominal pain secondary to #1 3. Obstructive jaundice secondary to #1 status post stent placement 10/03/2015 4. Nausea 5. Glaucoma 6. Altered mental status 10/28/2015. Long-acting pain medication and Remeron discontinued. Mental status improved 10/31/2015. 7. Anemia and thrombocytopenia 11/11/2015. Likely secondary to chemotherapy. He was transfused 2 units of blood 11/12/2015.   Disposition: Mr. Sharman appears improved. T Bilirubin has decreased to 1.32. The plan is to proceed with cycle 3 gemcitabine/Abraxane today as scheduled. He will return for a follow-up visit and cycle 4 in 2 weeks. He will contact the office in the interim with any problems.  Roger Bussing, DNP, AGPCNP-BC, AOCNP  12/02/2015  4:06 PM

## 2015-12-02 NOTE — Patient Instructions (Signed)
[facility] Discharge Instructions for Patients Receiving Chemotherapy  Today you received the following chemotherapy agents:  Abraxane and Gemzar.  To help prevent nausea and vomiting after your treatment, we encourage you to take your nausea medication as prescribed.   If you develop nausea and vomiting that is not controlled by your nausea medication, call the clinic.   BELOW ARE SYMPTOMS THAT SHOULD BE REPORTED IMMEDIATELY:  *FEVER GREATER THAN 100.5 F  *CHILLS WITH OR WITHOUT FEVER  NAUSEA AND VOMITING THAT IS NOT CONTROLLED WITH YOUR NAUSEA MEDICATION  *UNUSUAL SHORTNESS OF BREATH  *UNUSUAL BRUISING OR BLEEDING  TENDERNESS IN MOUTH AND THROAT WITH OR WITHOUT PRESENCE OF ULCERS  *URINARY PROBLEMS  *BOWEL PROBLEMS  UNUSUAL RASH Items with * indicate a potential emergency and should be followed up as soon as possible.  Feel free to call the clinic you have any questions or concerns. The clinic phone number is [phone].  Please show the CHEMO ALERT CARD at check-in to the Emergency Department and triage nurse.   

## 2015-12-16 ENCOUNTER — Ambulatory Visit (HOSPITAL_BASED_OUTPATIENT_CLINIC_OR_DEPARTMENT_OTHER): Payer: Medicare Other | Admitting: Oncology

## 2015-12-16 ENCOUNTER — Telehealth: Payer: Self-pay | Admitting: Oncology

## 2015-12-16 ENCOUNTER — Ambulatory Visit: Payer: Medicare Other | Admitting: Nutrition

## 2015-12-16 ENCOUNTER — Ambulatory Visit (HOSPITAL_BASED_OUTPATIENT_CLINIC_OR_DEPARTMENT_OTHER): Payer: Medicare Other

## 2015-12-16 ENCOUNTER — Other Ambulatory Visit (HOSPITAL_BASED_OUTPATIENT_CLINIC_OR_DEPARTMENT_OTHER): Payer: Medicare Other

## 2015-12-16 ENCOUNTER — Encounter: Payer: Self-pay | Admitting: *Deleted

## 2015-12-16 VITALS — BP 92/60 | HR 66 | Temp 97.5°F | Resp 18 | Ht 67.5 in | Wt 105.6 lb

## 2015-12-16 DIAGNOSIS — C787 Secondary malignant neoplasm of liver and intrahepatic bile duct: Secondary | ICD-10-CM | POA: Diagnosis not present

## 2015-12-16 DIAGNOSIS — Z5111 Encounter for antineoplastic chemotherapy: Secondary | ICD-10-CM

## 2015-12-16 DIAGNOSIS — G893 Neoplasm related pain (acute) (chronic): Secondary | ICD-10-CM

## 2015-12-16 DIAGNOSIS — R630 Anorexia: Secondary | ICD-10-CM | POA: Diagnosis not present

## 2015-12-16 DIAGNOSIS — C250 Malignant neoplasm of head of pancreas: Secondary | ICD-10-CM | POA: Diagnosis not present

## 2015-12-16 LAB — CBC WITH DIFFERENTIAL/PLATELET
BASO%: 0.4 % (ref 0.0–2.0)
BASOS ABS: 0 10*3/uL (ref 0.0–0.1)
EOS ABS: 0.4 10*3/uL (ref 0.0–0.5)
EOS%: 4.4 % (ref 0.0–7.0)
HCT: 27.9 % — ABNORMAL LOW (ref 38.4–49.9)
HGB: 8.9 g/dL — ABNORMAL LOW (ref 13.0–17.1)
LYMPH%: 9.5 % — AB (ref 14.0–49.0)
MCH: 28.8 pg (ref 27.2–33.4)
MCHC: 31.9 g/dL — ABNORMAL LOW (ref 32.0–36.0)
MCV: 90.3 fL (ref 79.3–98.0)
MONO#: 1.4 10*3/uL — ABNORMAL HIGH (ref 0.1–0.9)
MONO%: 15.5 % — AB (ref 0.0–14.0)
NEUT#: 6.5 10*3/uL (ref 1.5–6.5)
NEUT%: 70.2 % (ref 39.0–75.0)
PLATELETS: 290 10*3/uL (ref 140–400)
RBC: 3.09 10*6/uL — AB (ref 4.20–5.82)
RDW: 18 % — ABNORMAL HIGH (ref 11.0–14.6)
WBC: 9.3 10*3/uL (ref 4.0–10.3)
lymph#: 0.9 10*3/uL (ref 0.9–3.3)
nRBC: 0 % (ref 0–0)

## 2015-12-16 LAB — COMPREHENSIVE METABOLIC PANEL
ALBUMIN: 3.1 g/dL — AB (ref 3.5–5.0)
ALK PHOS: 246 U/L — AB (ref 40–150)
ALT: 22 U/L (ref 0–55)
AST: 31 U/L (ref 5–34)
Anion Gap: 11 mEq/L (ref 3–11)
BUN: 20.3 mg/dL (ref 7.0–26.0)
CALCIUM: 8.9 mg/dL (ref 8.4–10.4)
CHLORIDE: 101 meq/L (ref 98–109)
CO2: 23 mEq/L (ref 22–29)
CREATININE: 1 mg/dL (ref 0.7–1.3)
EGFR: 71 mL/min/{1.73_m2} — ABNORMAL LOW (ref >90)
GLUCOSE: 147 mg/dL — AB (ref 70–140)
Potassium: 3.7 mEq/L (ref 3.5–5.1)
SODIUM: 135 meq/L — AB (ref 136–145)
TOTAL PROTEIN: 5.9 g/dL — AB (ref 6.4–8.3)
Total Bilirubin: 1.72 mg/dL — ABNORMAL HIGH (ref 0.20–1.20)

## 2015-12-16 MED ORDER — SODIUM CHLORIDE 0.9 % IV SOLN
800.0000 mg/m2 | Freq: Once | INTRAVENOUS | Status: AC
  Administered 2015-12-16: 1254 mg via INTRAVENOUS
  Filled 2015-12-16: qty 32.98

## 2015-12-16 MED ORDER — PROCHLORPERAZINE MALEATE 5 MG PO TABS: 5.0000 mg | ORAL_TABLET | Freq: Four times a day (QID) | ORAL | Status: AC | PRN

## 2015-12-16 MED ORDER — SODIUM CHLORIDE 0.9 % IV SOLN
Freq: Once | INTRAVENOUS | Status: AC
  Administered 2015-12-16: 12:00:00 via INTRAVENOUS
  Filled 2015-12-16: qty 4

## 2015-12-16 MED ORDER — SODIUM CHLORIDE 0.9 % IV SOLN
Freq: Once | INTRAVENOUS | Status: AC
  Administered 2015-12-16: 11:00:00 via INTRAVENOUS

## 2015-12-16 MED ORDER — PACLITAXEL PROTEIN-BOUND CHEMO INJECTION 100 MG
75.0000 mg/m2 | Freq: Once | INTRAVENOUS | Status: AC
  Administered 2015-12-16: 125 mg via INTRAVENOUS
  Filled 2015-12-16: qty 25

## 2015-12-16 MED ORDER — OXYCODONE HCL 5 MG PO TABS: 5.0000 mg | ORAL_TABLET | ORAL | Status: AC | PRN

## 2015-12-16 NOTE — Telephone Encounter (Signed)
Gave and printed appt sched and avs fo rpt for May °

## 2015-12-16 NOTE — Progress Notes (Signed)
Nutrition follow-up completed with patient in infusion for cancer of the pancreas. Patient states he continues to have difficulty eating. States he can drink liquids without difficulty. Weight decreased and documented as 105.6 pounds May 1, down from 108.5 pounds April 17.  Nutrition diagnosis: Inadequate oral intake continues.  Intervention:  Recommended patient consume 5 Ensure Plus daily. Encouraged patient to increase calories and protein using foods as desired. Offered patient additional samples of Ensure Plus however he declined. Teach back method used.  Monitoring, evaluation, goals: Patient will work to increase calories and protein to minimize weight loss.  Next visit: Monday, May 15, during infusion.  **Disclaimer: This note was dictated with voice recognition software. Similar sounding words can inadvertently be transcribed and this note may contain transcription errors which may not have been corrected upon publication of note.**

## 2015-12-16 NOTE — Progress Notes (Signed)
  [facility] OFFICE PROGRESS NOTE   Diagnosis:  Pancreas cancer  INTERVAL HISTORY:    Roger Vaughn completed cycle 3 gemcitabine/Abraxane 12/02/2015. No nausea or neuropathy symptoms. He complains of malaise. The abdominal pain is controlled with oxycodone. He stays in a house most of the time. Poor appetite. Roger Vaughn and his wife reports his performance status has improved as compared to 3 chemotherapy.  Objective:  Vital signs in last 24 hours:  Blood pressure 92/60, pulse 66, temperature 97.5 F (36.4 C), temperature source Oral, resp. rate 18, height 5' 7.5" (1.715 m), weight 105 lb 9.6 oz (47.9 kg), SpO2 96 %.    HEENT:  No thrush or ulcers Resp:  Lungs clear bilaterally Cardio:  Regular rate and rhythm GI:  No hepatomegaly, nontender Vascular:  No leg edema Neuro: alert    Portacath/PICC-without erythema  Lab Results:  Lab Results  Component Value Date   WBC 9.3 12/16/2015   HGB 8.9* 12/16/2015   HCT 27.9* 12/16/2015   MCV 90.3 12/16/2015   PLT 290 12/16/2015   NEUTROABS 6.5 12/16/2015     Medications: I have reviewed the patient's current medications.  Assessment/Plan: 1. Pancreas cancer presenting with abdominal pain and jaundice  CT abdomen/pelvis 09/25/2015 with a mass in the head/uncinate process of the pancreas; diffuse biliary ductal dilatation secondary to obstruction of the central common bile duct at the head of the pancreas; multiple small hepatic hypodense lesions.  10/03/2015 upper EUS with findings of a 20 mm x 25 mm round hypoechoic mass in the head of the pancreas with upstream pancreatic and biliary ductal dilatation. The mass was noted to abut the portal vein at the confluence and invade the SMV for several centimeters. No obvious involvement of the celiac artery or SMA. No obvious peripancreatic adenopathy. Multiple biopsies were obtained. Pathology showed malignant cells consistent with adenocarcinoma.   10/03/2015 ERCP with  stent placement.  10/18/2015 MRI abdomen with widespread metastatic disease to the liver, large pancreatic head mass causing pancreatic ductal obstruction and probable malignant lymphadenopathy in the portacaval nodal station.  Cycle 1 gemcitabine/Abraxane 11/05/2015  Cycle 2 gemcitabine/Abraxane 11/18/2015  Cycle 3 gemcitabine/Abraxane 12/02/2015   cycle 4 gemcitabine/Abraxane 12/16/2015 2. Abdominal pain secondary to #1 3. Obstructive jaundice secondary to #1 status post stent placement 10/03/2015 4. Nausea 5. Glaucoma 6. Altered mental status 10/28/2015. Long-acting pain medication and Remeron discontinued. Mental status improved 10/31/2015. 7. Anemia and thrombocytopenia 11/11/2015. Likely secondary to chemotherapy. He was transfused 2 units of blood 11/12/2015.   Disposition:   Roger Vaughn appears stable. I suspect the malaise is related to chemotherapy and the metastatic pancreas cancer. His overall performance status remains improved compared to prechemotherapy. The plan is to proceed with cycle 4 gemcitabine/Abraxane today. We will follow-up on the CA 19 -9 today. He will return for an office visit and chemotherapy in 2 weeks. We will plan for repeat imaging of the liver after the next treatment with chemotherapy.   I will ask the [facility] nutritionist to see him.  Betsy Coder, MD  12/16/2015  10:33 AM

## 2015-12-16 NOTE — Patient Instructions (Signed)
Nanoparticle Albumin-Bound Paclitaxel injection  What is this medicine?  NANOPARTICLE ALBUMIN-BOUND PACLITAXEL (Na no PAHR ti kuhl al BYOO muhn-bound PAK li TAX el) is a chemotherapy drug. It targets fast dividing cells, like cancer cells, and causes these cells to die. This medicine is used to treat advanced breast cancer and advanced lung cancer.  This medicine may be used for other purposes; ask your health care provider or pharmacist if you have questions.  What should I tell my health care provider before I take this medicine?  They need to know if you have any of these conditions:  -kidney disease  -liver disease  -low blood counts, like low platelets, red blood cells, or white blood cells  -recent or ongoing radiation therapy  -an unusual or allergic reaction to paclitaxel, albumin, other chemotherapy, other medicines, foods, dyes, or preservatives  -pregnant or trying to get pregnant  -breast-feeding  How should I use this medicine?  This drug is given as an infusion into a vein. It is administered in a hospital or clinic by a specially trained health care professional.  Talk to your pediatrician regarding the use of this medicine in children. Special care may be needed.  Overdosage: If you think you have taken too much of this medicine contact a poison control center or emergency room at once.  NOTE: This medicine is only for you. Do not share this medicine with others.  What if I miss a dose?  It is important not to miss your dose. Call your doctor or health care professional if you are unable to keep an appointment.  What may interact with this medicine?  -cyclosporine  -diazepam  -ketoconazole  -medicines to increase blood counts like filgrastim, pegfilgrastim, sargramostim  -other chemotherapy drugs like cisplatin, doxorubicin, epirubicin, etoposide, teniposide, vincristine  -quinidine  -testosterone  -vaccines  -verapamil  Talk to your doctor or health care professional before taking any of these  medicines:  -acetaminophen  -aspirin  -ibuprofen  -ketoprofen  -naproxen  This list may not describe all possible interactions. Give your health care provider a list of all the medicines, herbs, non-prescription drugs, or dietary supplements you use. Also tell them if you smoke, drink alcohol, or use illegal drugs. Some items may interact with your medicine.  What should I watch for while using this medicine?  Your condition will be monitored carefully while you are receiving this medicine. You will need important blood work done while you are taking this medicine.  This drug may make you feel generally unwell. This is not uncommon, as chemotherapy can affect healthy cells as well as cancer cells. Report any side effects. Continue your course of treatment even though you feel ill unless your doctor tells you to stop.  In some cases, you may be given additional medicines to help with side effects. Follow all directions for their use.  Call your doctor or health care professional for advice if you get a fever, chills or sore throat, or other symptoms of a cold or flu. Do not treat yourself. This drug decreases your body's ability to fight infections. Try to avoid being around people who are sick.  This medicine may increase your risk to bruise or bleed. Call your doctor or health care professional if you notice any unusual bleeding.  Be careful brushing and flossing your teeth or using a toothpick because you may get an infection or bleed more easily. If you have any dental work done, tell your dentist you are receiving this   medicine.  Avoid taking products that contain aspirin, acetaminophen, ibuprofen, naproxen, or ketoprofen unless instructed by your doctor. These medicines may hide a fever.  Do not become pregnant while taking this medicine. Women should inform their doctor if they wish to become pregnant or think they might be pregnant. There is a potential for serious side effects to an unborn child. Talk to  your health care professional or pharmacist for more information. Do not breast-feed an infant while taking this medicine.  Men are advised not to father a child while receiving this medicine.  What side effects may I notice from receiving this medicine?  Side effects that you should report to your doctor or health care professional as soon as possible:  -allergic reactions like skin rash, itching or hives, swelling of the face, lips, or tongue  -low blood counts - This drug may decrease the number of white blood cells, red blood cells and platelets. You may be at increased risk for infections and bleeding.  -signs of infection - fever or chills, cough, sore throat, pain or difficulty passing urine  -signs of decreased platelets or bleeding - bruising, pinpoint red spots on the skin, black, tarry stools, nosebleeds  -signs of decreased red blood cells - unusually weak or tired, fainting spells, lightheadedness  -breathing problems  -changes in vision  -chest pain  -high or low blood pressure  -mouth sores  -nausea and vomiting  -pain, swelling, redness or irritation at the injection site  -pain, tingling, numbness in the hands or feet  -slow or irregular heartbeat  -swelling of the ankle, feet, hands  Side effects that usually do not require medical attention (report to your doctor or health care professional if they continue or are bothersome):  -aches, pains  -changes in the color of fingernails  -diarrhea  -hair loss  -loss of appetite  This list may not describe all possible side effects. Call your doctor for medical advice about side effects. You may report side effects to FDA at 1-800-FDA-1088.  Where should I keep my medicine?  This drug is given in a hospital or clinic and will not be stored at home.  NOTE: This sheet is a summary. It may not cover all possible information. If you have questions about this medicine, talk to your doctor, pharmacist, or health care provider.      2016, Elsevier/Gold Standard.  (2012-09-26 16:48:50)  Gemcitabine injection  What is this medicine?  GEMCITABINE (jem SIT a been) is a chemotherapy drug. This medicine is used to treat many types of cancer like breast cancer, lung cancer, pancreatic cancer, and ovarian cancer.  This medicine may be used for other purposes; ask your health care provider or pharmacist if you have questions.  What should I tell my health care provider before I take this medicine?  They need to know if you have any of these conditions:  -blood disorders  -infection  -kidney disease  -liver disease  -recent or ongoing radiation therapy  -an unusual or allergic reaction to gemcitabine, other chemotherapy, other medicines, foods, dyes, or preservatives  -pregnant or trying to get pregnant  -breast-feeding  How should I use this medicine?  This drug is given as an infusion into a vein. It is administered in a hospital or clinic by a specially trained health care professional.  Talk to your pediatrician regarding the use of this medicine in children. Special care may be needed.  Overdosage: If you think you have taken too much of this   medicine contact a poison control center or emergency room at once.  NOTE: This medicine is only for you. Do not share this medicine with others.  What if I miss a dose?  It is important not to miss your dose. Call your doctor or health care professional if you are unable to keep an appointment.  What may interact with this medicine?  -medicines to increase blood counts like filgrastim, pegfilgrastim, sargramostim  -some other chemotherapy drugs like cisplatin  -vaccines  Talk to your doctor or health care professional before taking any of these medicines:  -acetaminophen  -aspirin  -ibuprofen  -ketoprofen  -naproxen  This list may not describe all possible interactions. Give your health care provider a list of all the medicines, herbs, non-prescription drugs, or dietary supplements you use. Also tell them if you smoke, drink alcohol, or use  illegal drugs. Some items may interact with your medicine.  What should I watch for while using this medicine?  Visit your doctor for checks on your progress. This drug may make you feel generally unwell. This is not uncommon, as chemotherapy can affect healthy cells as well as cancer cells. Report any side effects. Continue your course of treatment even though you feel ill unless your doctor tells you to stop.  In some cases, you may be given additional medicines to help with side effects. Follow all directions for their use.  Call your doctor or health care professional for advice if you get a fever, chills or sore throat, or other symptoms of a cold or flu. Do not treat yourself. This drug decreases your body's ability to fight infections. Try to avoid being around people who are sick.  This medicine may increase your risk to bruise or bleed. Call your doctor or health care professional if you notice any unusual bleeding.  Be careful brushing and flossing your teeth or using a toothpick because you may get an infection or bleed more easily. If you have any dental work done, tell your dentist you are receiving this medicine.  Avoid taking products that contain aspirin, acetaminophen, ibuprofen, naproxen, or ketoprofen unless instructed by your doctor. These medicines may hide a fever.  Women should inform their doctor if they wish to become pregnant or think they might be pregnant. There is a potential for serious side effects to an unborn child. Talk to your health care professional or pharmacist for more information. Do not breast-feed an infant while taking this medicine.  What side effects may I notice from receiving this medicine?  Side effects that you should report to your doctor or health care professional as soon as possible:  -allergic reactions like skin rash, itching or hives, swelling of the face, lips, or tongue  -low blood counts - this medicine may decrease the number of white blood cells, red  blood cells and platelets. You may be at increased risk for infections and bleeding.  -signs of infection - fever or chills, cough, sore throat, pain or difficulty passing urine  -signs of decreased platelets or bleeding - bruising, pinpoint red spots on the skin, black, tarry stools, blood in the urine  -signs of decreased red blood cells - unusually weak or tired, fainting spells, lightheadedness  -breathing problems  -chest pain  -mouth sores  -nausea and vomiting  -pain, swelling, redness at site where injected  -pain, tingling, numbness in the hands or feet  -stomach pain  -swelling of ankles, feet, hands  -unusual bleeding  Side effects that usually do

## 2015-12-17 LAB — CANCER ANTIGEN 19-9

## 2015-12-18 ENCOUNTER — Telehealth: Payer: Self-pay | Admitting: *Deleted

## 2015-12-18 ENCOUNTER — Other Ambulatory Visit: Payer: Self-pay | Admitting: Oncology

## 2015-12-18 NOTE — Telephone Encounter (Addendum)
Spoke with pt's wife, she thinks he may be able to tolerate MRI if he takes Xanax prior to exam. Pt has Xanax at home. Will request open MRI and she will explain to pt that they will muffle the sound for him.  Informed Mrs. Hanser of CA19-9 result. She voiced understanding.

## 2015-12-18 NOTE — Telephone Encounter (Signed)
-----   Message from Ladell Pier, MD sent at 12/17/2015  5:38 PM EDT ----- Please call patient, ca19-9 is higher, schedule MRI abdomen w/wo contrast prior to next visit if he can tolerate- can ask for open mri on [address], if not schedule ct abd/pelvis with contrast

## 2015-12-19 NOTE — Telephone Encounter (Signed)
Scheduling MRI liver with/without contrast  Thanks

## 2015-12-20 ENCOUNTER — Telehealth: Payer: Self-pay | Admitting: Nurse Practitioner

## 2015-12-20 ENCOUNTER — Other Ambulatory Visit: Payer: Self-pay | Admitting: Nurse Practitioner

## 2015-12-20 DIAGNOSIS — C250 Malignant neoplasm of head of pancreas: Secondary | ICD-10-CM

## 2015-12-20 NOTE — Telephone Encounter (Signed)
cld pt to adv to call [facility] [phone] to do screening so appt can be sch-adv appt here on 5/15@1 :45

## 2015-12-24 ENCOUNTER — Ambulatory Visit
Admission: RE | Admit: 2015-12-24 | Discharge: 2015-12-24 | Disposition: A | Discharge status: Home / Self Care | Payer: Medicare Other | Source: Ambulatory Visit | Attending: Nurse Practitioner | Admitting: Nurse Practitioner

## 2015-12-24 DIAGNOSIS — C250 Malignant neoplasm of head of pancreas: Secondary | ICD-10-CM

## 2015-12-24 MED ORDER — GADOBENATE DIMEGLUMINE 529 MG/ML IV SOLN
9.0000 mL | Freq: Once | INTRAVENOUS | Status: AC | PRN
  Administered 2015-12-24: 9 mL via INTRAVENOUS

## 2015-12-25 ENCOUNTER — Ambulatory Visit (HOSPITAL_BASED_OUTPATIENT_CLINIC_OR_DEPARTMENT_OTHER): Payer: Medicare Other | Admitting: Nurse Practitioner

## 2015-12-25 ENCOUNTER — Telehealth: Payer: Self-pay | Admitting: *Deleted

## 2015-12-25 ENCOUNTER — Other Ambulatory Visit (HOSPITAL_BASED_OUTPATIENT_CLINIC_OR_DEPARTMENT_OTHER): Payer: Medicare Other

## 2015-12-25 ENCOUNTER — Ambulatory Visit: Payer: Medicare Other

## 2015-12-25 VITALS — BP 83/55 | HR 105 | Temp 98.1°F | Resp 17 | Ht 67.5 in | Wt 103.2 lb

## 2015-12-25 DIAGNOSIS — R0609 Other forms of dyspnea: Secondary | ICD-10-CM

## 2015-12-25 DIAGNOSIS — D649 Anemia, unspecified: Secondary | ICD-10-CM

## 2015-12-25 DIAGNOSIS — C250 Malignant neoplasm of head of pancreas: Secondary | ICD-10-CM

## 2015-12-25 DIAGNOSIS — R970 Elevated carcinoembryonic antigen [CEA]: Secondary | ICD-10-CM

## 2015-12-25 DIAGNOSIS — C787 Secondary malignant neoplasm of liver and intrahepatic bile duct: Secondary | ICD-10-CM | POA: Diagnosis not present

## 2015-12-25 DIAGNOSIS — G893 Neoplasm related pain (acute) (chronic): Secondary | ICD-10-CM | POA: Diagnosis not present

## 2015-12-25 LAB — CBC WITH DIFFERENTIAL/PLATELET
BASO%: 0.4 % (ref 0.0–2.0)
BASOS ABS: 0 10*3/uL (ref 0.0–0.1)
EOS%: 3.6 % (ref 0.0–7.0)
Eosinophils Absolute: 0.3 10*3/uL (ref 0.0–0.5)
HCT: 24 % — ABNORMAL LOW (ref 38.4–49.9)
HGB: 7.5 g/dL — ABNORMAL LOW (ref 13.0–17.1)
LYMPH%: 16.3 % (ref 14.0–49.0)
MCH: 28.5 pg (ref 27.2–33.4)
MCHC: 31.3 g/dL — ABNORMAL LOW (ref 32.0–36.0)
MCV: 91.3 fL (ref 79.3–98.0)
MONO#: 0.7 10*3/uL (ref 0.1–0.9)
MONO%: 10.2 % (ref 0.0–14.0)
NEUT%: 69.5 % (ref 39.0–75.0)
NEUTROS ABS: 5 10*3/uL (ref 1.5–6.5)
NRBC: 2 % — AB (ref 0–0)
Platelets: 154 10*3/uL (ref 140–400)
RBC: 2.63 10*6/uL — AB (ref 4.20–5.82)
RDW: 19.4 % — AB (ref 11.0–14.6)
WBC: 7.2 10*3/uL (ref 4.0–10.3)
lymph#: 1.2 10*3/uL (ref 0.9–3.3)

## 2015-12-25 LAB — COMPREHENSIVE METABOLIC PANEL
ALT: 27 U/L (ref 0–55)
AST: 27 U/L (ref 5–34)
Albumin: 2.9 g/dL — ABNORMAL LOW (ref 3.5–5.0)
Alkaline Phosphatase: 283 U/L — ABNORMAL HIGH (ref 40–150)
Anion Gap: 11 mEq/L (ref 3–11)
BILIRUBIN TOTAL: 1.25 mg/dL — AB (ref 0.20–1.20)
BUN: 18.5 mg/dL (ref 7.0–26.0)
CO2: 23 meq/L (ref 22–29)
Calcium: 8.8 mg/dL (ref 8.4–10.4)
Chloride: 104 mEq/L (ref 98–109)
Creatinine: 1 mg/dL (ref 0.7–1.3)
EGFR: 77 mL/min/{1.73_m2} — AB (ref >90)
GLUCOSE: 124 mg/dL (ref 70–140)
Potassium: 3.6 mEq/L (ref 3.5–5.1)
SODIUM: 138 meq/L (ref 136–145)
TOTAL PROTEIN: 5.8 g/dL — AB (ref 6.4–8.3)

## 2015-12-25 NOTE — Telephone Encounter (Signed)
TC from pt's wife asking to speak with her friend, Tim Lair, who brought patient to the [facility] this afternoon.  Spoke with Tanya,RN and pt declined IVF's  And is on his way back home.  TC to Mrs. Warn and made her aware that her husband is on his way home, that he declined the IVF's and also that he is anemic and he wants to talk to her about getting a blood transfusion. Hospice [facility]) referral also made today-informed wife of this as well. She voiced understanding, though concerned he refused the IVFs.  She states she will discuss with him when he gets home.

## 2015-12-25 NOTE — Telephone Encounter (Signed)
TC received from Mrs. Novakovic. She was able to wake up Roger Vaughn and speak with him about his current situation. He is now agreeable to coming to cancer center for exam and IV fluids.  She has a friend who is a retired [employer] scientist who is bringing him here now. Spoke with Lavella Lemons, RN and advised her of the above. [name], NP will see him and address dehydration status.  Pt does not have a port for IV access.

## 2015-12-25 NOTE — Addendum Note (Signed)
Addended by: Rosalio Macadamia C on: 12/25/2015 02:50 PM   Modules accepted: Orders

## 2015-12-25 NOTE — Telephone Encounter (Signed)
TC from pt's wife initially inquiring about results of yesterday's MRI.  She is concerned about the results because she states her husband is not doing well at all. She wants to know if the cancer has gotten worse, despite recent chemotherapy treatments.  She states Mr. Laramore is sleeping 23 hours out of 24. He is not eating or drinking-maybe 1/2 can of ensure yesterday-nothing today.  He wakes up long enough for a pain pill and then goes back to sleep. She cannot get him out of bed by herself and there is no one else there to help her.  She states his urine is getting darker in color and that he looks even thinner now than he did last week.  She believes he is getting quite dehydrated.  Asked wife is she can get him to local ED. She states she cannot get him in the car by herself and also that Mr. Kiess has told her he does not want to go to the ED or the hospital.  She believes he has given up.  Advised Mrs. Thone that he may start to feel some better after getting re-hydrated in the hospital or even here in the [facility].   Also discussed with her the option of staying home with Hospice care. Advised her that with hospice he could still see Dr. Benay Spice if needed but would not be getting the chemotherapy. Advised her that hospice provides intermittant visits, not 24/hr care but that Hospice could certainly support her and Mr. Gillott as he declines with a variety of care--personal care, symptom management, end of life care, etc.  Wife states that knowing the results of the MRI will help her make the decision. And knowing Dr. Gearldine Shown recommendations. Overall she is struggling to care for him alone and certainly needs assistance. She wants him to be comfortable and not suffer.  Discussed with Lavella Lemons, RN  And [name], NP. Received recommendation for pt to try and come to the clinic for exam and re-hydration. If wife unable, we can make hospice referral. Dr. Benay Spice to review yesterday's MRI  TC back to  wife and reviewed options available at this time- be seen here at the cancer center; call EMS for transport to ED or Hospice referral in [facility] where they live. Explained to her that there have been some positive and some negative changes on the scan but we are waiting for Dr. Benay Spice to review.  Mrs. Beesley states she cannot bring him to the clinic today and likely he will be worse tomorrow if he goes untreated for dehydration.  She voices understanding, able to repeat back to me her options. She states she needs to think about all of this and will call back in a little while. Emotional support extended as she struggles with all of this.

## 2015-12-25 NOTE — Progress Notes (Addendum)
[place] OFFICE PROGRESS NOTE   Diagnosis:  Pancreas cancer  INTERVAL HISTORY:   Mr. Sylte returns prior to scheduled follow-up. He completed cycle 4 gemcitabine/Abraxane 12/16/2015. He reports progressive weakness. He is "sleeping a lot". He denies fever. Oral intake is poor. He reports pain is well-controlled. No nausea or vomiting. He had some loose stools yesterday. He has dyspnea on exertion.  Objective:  Vital signs in last 24 hours:  Blood pressure 83/55, pulse 105, temperature 98.1 F (36.7 C), temperature source Oral, resp. rate 17, height 5' 7.5" (1.715 m), weight 103 lb 3.2 oz (46.811 kg), SpO2 100 %.    HEENT: No thrush or ulcers. Mucous membranes appear moist. Resp: Distant breath sounds. Lungs are clear. Cardio: Regular, mildly tachycardic. GI: Abdomen is nontender. Question palpable liver edge. Vascular: No leg edema. Neuro: Alert and oriented.  Skin: Mild decrease in skin turgor.    Lab Results:  Lab Results  Component Value Date   WBC 7.2 12/25/2015   HGB 7.5* 12/25/2015   HCT 24.0* 12/25/2015   MCV 91.3 12/25/2015   PLT 154 12/25/2015   NEUTROABS 5.0 12/25/2015    Imaging:  Mr Abdomen W Wo Contrast  12/24/2015  CLINICAL DATA:  Pancreatic cancer with liver metastasis. EXAM: MRI ABDOMEN WITHOUT AND WITH CONTRAST TECHNIQUE: Multiplanar multisequence MR imaging of the abdomen was performed both before and after the administration of intravenous contrast. CONTRAST:  90mL MULTIHANCE GADOBENATE DIMEGLUMINE 529 MG/ML IV SOLN COMPARISON:  MRI 10/18/2015 FINDINGS: This examination is quite limited due to respiratory motion. CT may be a better option for this patient. Extensive and progressive hepatic metastatic disease. Right hepatic lobe lesion on series 4, image 14 measures 32 x 32 mm and previously measured 20 x 24 mm. The second right hepatic lobe lesion on the same image measures 17 x 14 mm and previously measured 12 x 12 mm. Segment 3 lesion  measures 18 x 15 mm and previously measured 18 x 16. I do not see any definite new hepatic lesions. Persistent dilatation of the main pancreatic duct with an abrupt cut off in the pancreatic head. The pancreatic head mass is difficult to evaluate. Maximum transverse diameter is approximately 17 mm and was previously 21 mm. Metallic biliary stent is noted.  Decrease in biliary dilatation. No other significant findings are demonstrated but again, exam is quite limited. IMPRESSION: Examination is quite limited due to respiratory motion. CT is a much better option for this patient. Slight progression of hepatic metastatic disease. Metallic biliary stent in place.  Decrease and biliary dilatation. The pancreatic head mass appears slightly smaller. Electronically Signed   By: Marijo Sanes M.D.   On: 12/24/2015 16:57    Medications: I have reviewed the patient's current medications.  Assessment/Plan: 1. Pancreas cancer presenting with abdominal pain and jaundice  CT abdomen/pelvis 09/25/2015 with a mass in the head/uncinate process of the pancreas; diffuse biliary ductal dilatation secondary to obstruction of the central common bile duct at the head of the pancreas; multiple small hepatic hypodense lesions.  10/03/2015 upper EUS with findings of a 20 mm x 25 mm round hypoechoic mass in the head of the pancreas with upstream pancreatic and biliary ductal dilatation. The mass was noted to abut the portal vein at the confluence and invade the SMV for several centimeters. No obvious involvement of the celiac artery or SMA. No obvious peripancreatic adenopathy. Multiple biopsies were obtained. Pathology showed malignant cells consistent with adenocarcinoma.   10/03/2015 ERCP with stent  placement.  10/18/2015 MRI abdomen with widespread metastatic disease to the liver, large pancreatic head mass causing pancreatic ductal obstruction and probable malignant lymphadenopathy in the portacaval nodal station.  Cycle  1 gemcitabine/Abraxane 11/05/2015  Cycle 2 gemcitabine/Abraxane 11/18/2015  Cycle 3 gemcitabine/Abraxane 12/02/2015  cycle 4 gemcitabine/Abraxane 12/16/2015  12/16/2015 CA-19-9 further elevated  12/24/2015 MRI abdomen with progression of hepatic metastatic disease. Pancreatic head mass slightly smaller. 2. Abdominal pain secondary to #1 3. Obstructive jaundice secondary to #1 status post stent placement 10/03/2015 4. Nausea 5. Glaucoma 6. Altered mental status 10/28/2015. Long-acting pain medication and Remeron discontinued. Mental status improved 10/31/2015. 7. Anemia and thrombocytopenia 11/11/2015. Likely secondary to chemotherapy. He was transfused 2 units of blood 11/12/2015.   Disposition: Mr. Kos completed a fourth cycle of gemcitabine/Abraxane on 12/16/2015. CA-19-9 was further elevated on 12/16/2015 and restaging MRI of the abdomen yesterday shows evidence of disease progression in the liver. He continues to have a poor performance status.  Dr. Benay Spice reviewed the above with Mr. Skoog and recommends discontinuation of gemcitabine/Abraxane. Mr. Osorto understands he is not a candidate for additional chemotherapy with his current performance status. We discussed a referral to the [facility] program. He is in agreement. We discussed CPR/end-of-life issues. He would like to consider this some more before making a decision regarding CODE STATUS.  He has progressive anemia likely due to the chemotherapy. We discussed a blood transfusion. He is undecided as to if he would like to receive a blood transfusion. He will let us know tomorrow.  He will return for his next visit as scheduled on 12/30/2015.  Patient seen with Dr. Benay Spice. 25 minutes were spent face-to-face at today's visit with the majority of that time involving counseling/coordination of care.   [name] ANP/GNP-BC   12/25/2015  3:45 PM  This was a shared visit with [name]. We reviewed the MRI  findings with Mr. Olmsted. There is clinical and MRI evidence of disease progression. The CA 19-9 is higher. We recommend discontinuing gemcitabine/Abraxane. His performance status is poor. I recommend Hospice care. He may be a candidate for [facility]. We will try to get hospice in place this week.  [name], M.D.

## 2015-12-26 ENCOUNTER — Telehealth: Payer: Self-pay

## 2015-12-26 NOTE — Telephone Encounter (Signed)
Call received from patients wife stating that patient does not want to come in today for blood or IVF's, but he is agreeable to coming in either tomorrow or Monday.  Tiffany LPN notified and will return call to [facility], patients wife.

## 2015-12-26 NOTE — Telephone Encounter (Signed)
Called and spoke to patients wife to follow up if patient wanted to get blood transfusion today or IVF. Per wife pt decided not to do either but he is still sleeping and will call when he wakes up and see how he is feeling.

## 2015-12-26 NOTE — Telephone Encounter (Signed)
Returned patients call, Spoke with patients wife. States patient does not feel up to coming in today for fluids or blood but maybe tomorrow (Friday) or Monday. States she doesn't want to set up an appt yet but agreed for this nurse to follow up with them tomorrow morning. Pt wife will call interm if any problems or questions.

## 2015-12-27 ENCOUNTER — Telehealth: Payer: Self-pay

## 2015-12-27 ENCOUNTER — Telehealth: Payer: Self-pay | Admitting: *Deleted

## 2015-12-27 NOTE — Telephone Encounter (Signed)
Call received from [facility] with [facility] to obtain verbal order from Dr. Benay Spice for DNR order for patient.  Order received for DNR per Dr. Benay Spice and Varney Biles notified.

## 2015-12-27 NOTE — Telephone Encounter (Signed)
Called to follow up with patient. Spoke with patients wife, states hospice came and saw patient yesterday and he has decided to not do any blood or fluids at all. Informed patients wife to call with any further questions or concerns. Roger Vaughn verbalized understanding.

## 2015-12-29 ENCOUNTER — Other Ambulatory Visit: Payer: Self-pay | Admitting: Oncology

## 2015-12-30 ENCOUNTER — Ambulatory Visit: Payer: Medicare Other

## 2015-12-30 ENCOUNTER — Encounter: Payer: Medicare Other | Admitting: Nutrition

## 2015-12-30 ENCOUNTER — Ambulatory Visit: Payer: Medicare Other | Admitting: Nurse Practitioner

## 2015-12-30 ENCOUNTER — Other Ambulatory Visit: Payer: Self-pay

## 2015-12-30 ENCOUNTER — Other Ambulatory Visit: Payer: Medicare Other

## 2015-12-30 ENCOUNTER — Telehealth: Payer: Self-pay | Admitting: Oncology

## 2015-12-30 ENCOUNTER — Telehealth: Payer: Self-pay

## 2015-12-30 NOTE — Telephone Encounter (Signed)
cx all appts per pof

## 2015-12-30 NOTE — Telephone Encounter (Signed)
Called and spoke with patients wife regarding missed appt and if patient or wife had any questions regarding hospice care. Spoke with Roger Vaughn who states the hospice nurse was in this morning and gave the pt 2 weeks to 1 month to live. Roger Vaughn has decided not to come for anymore visits at this time. Informed Roger Vaughn if they had any questions or concerns to please call. Informed [name], NP and Dr. Benay Spice.

## 2016-01-09 ENCOUNTER — Encounter: Payer: Self-pay | Admitting: Oncology

## 2016-01-10 ENCOUNTER — Encounter: Payer: Self-pay | Admitting: *Deleted

## 2016-01-10 NOTE — Progress Notes (Signed)
Call received from [facility] with [facility] to inform Dr. Benay Spice that patient will be transferred to [facility] today.  Dr. Benay Spice notified.

## 2016-01-20 ENCOUNTER — Telehealth: Payer: Self-pay | Admitting: Oncology

## 2016-01-20 NOTE — Telephone Encounter (Signed)
Death certificate received in [facility]. Patient status changed to deceased. °

## 2016-01-20 NOTE — Telephone Encounter (Signed)
[place] that the death certificate is ready

## 2016-02-15 ENCOUNTER — Other Ambulatory Visit: Payer: Self-pay | Admitting: Nurse Practitioner

## 2016-02-15 DEATH — deceased

## 2016-02-17 ENCOUNTER — Other Ambulatory Visit: Payer: Self-pay | Admitting: Nurse Practitioner

## 2017-06-06 IMAGING — CT CT ABD-PELV W/ CM
2 of 5 series · 14 of 46 positions shown, 16 images · IV contrast (OMNIPAQUE 300)
Comparison: None.

CLINICAL DATA: 74-year-old male with abdominal pain and jaundice
patient presenting with weight loss.

EXAM:
CT ABDOMEN AND PELVIS WITH CONTRAST
TECHNIQUE: Multidetector CT imaging of the abdomen and pelvis was performed
using the standard protocol following bolus administration of
intravenous contrast.
CONTRAST:  25mL OMNIPAQUE IOHEXOL 300 MG/ML SOLN, 100mL OMNIPAQUE
IOHEXOL 300 MG/ML SOLN

[Series 2: abd/pel with · axial · 0.65mm/px · z∈[+1034,+1428]mm · 11 of 89 slices shown, 13 images]
[im 5/89  soft-tissue]
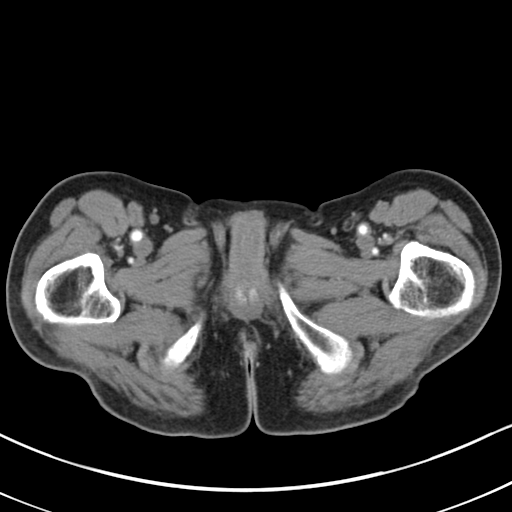
[im 5/89  bone]
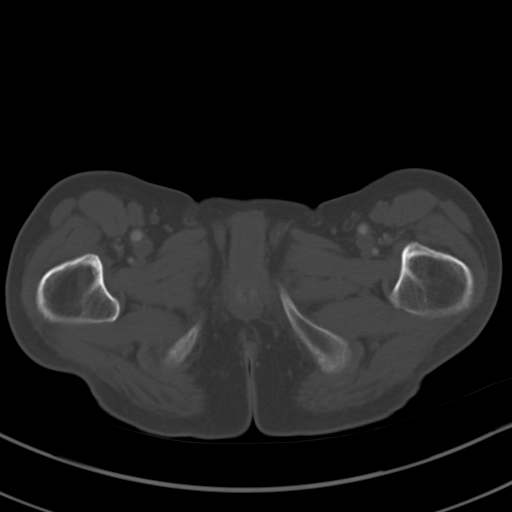
[im 14/89  soft-tissue]
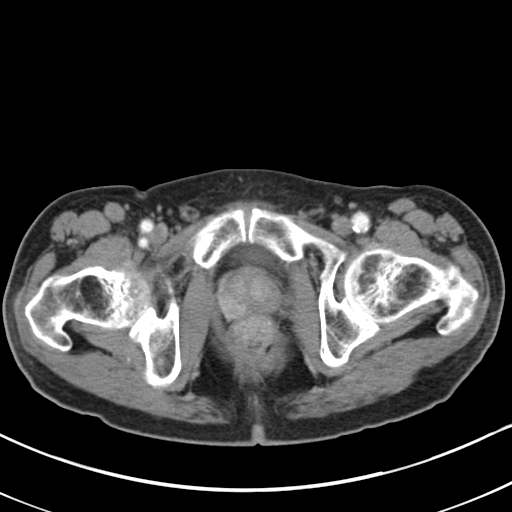
[im 24/89  soft-tissue]
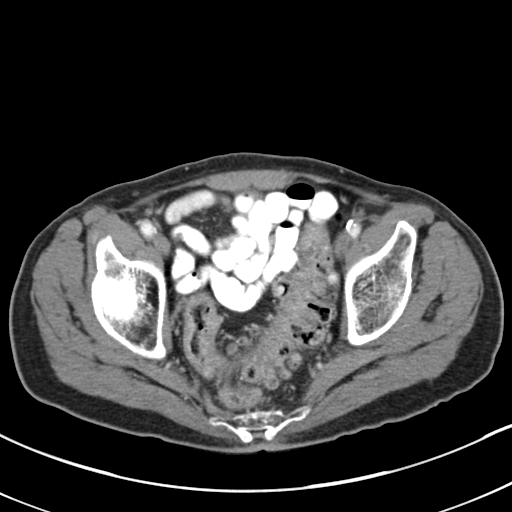
[im 28/89  soft-tissue]
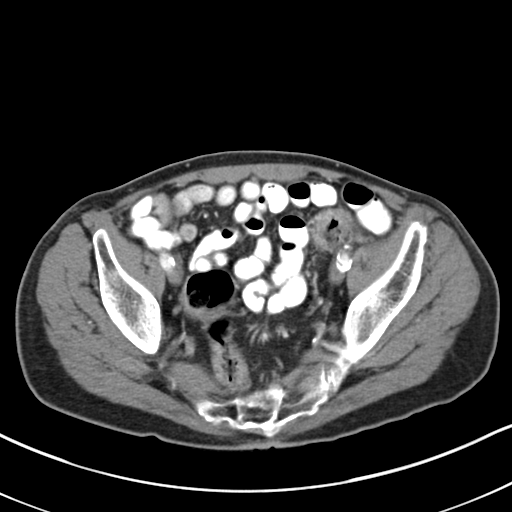
[im 38/89  soft-tissue]
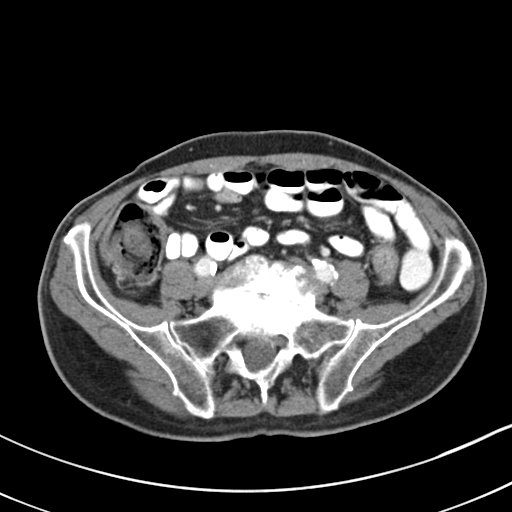
[im 47/89  soft-tissue]
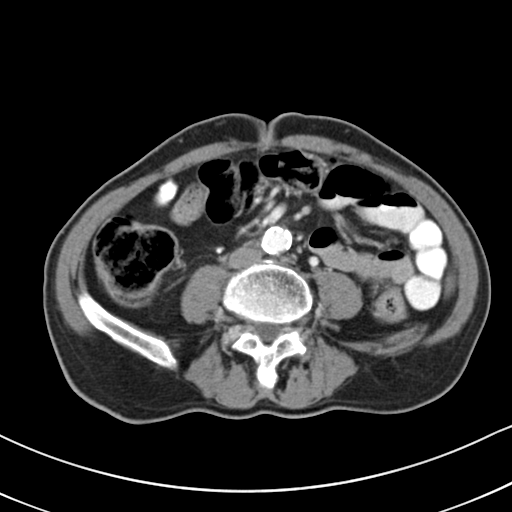
[im 51/89  soft-tissue]
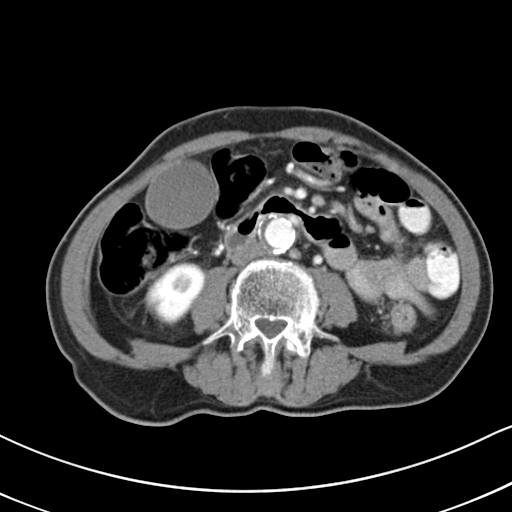
[im 61/89  soft-tissue]
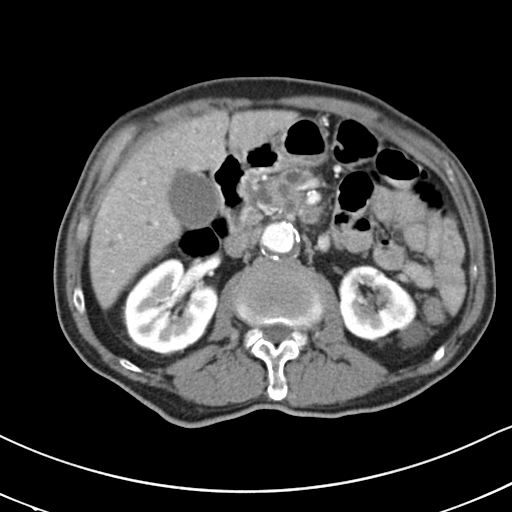
[im 65/89  soft-tissue]
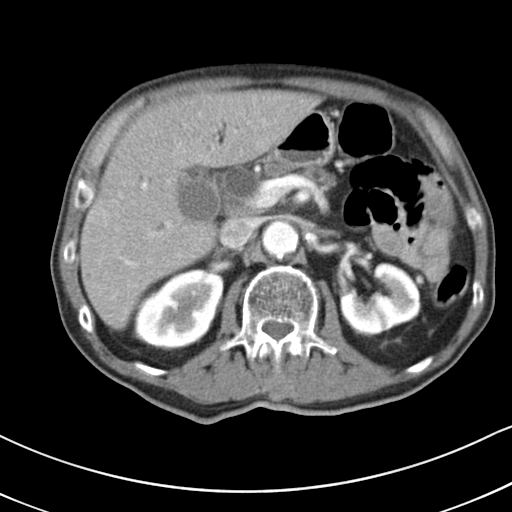
[im 65/89  bone]
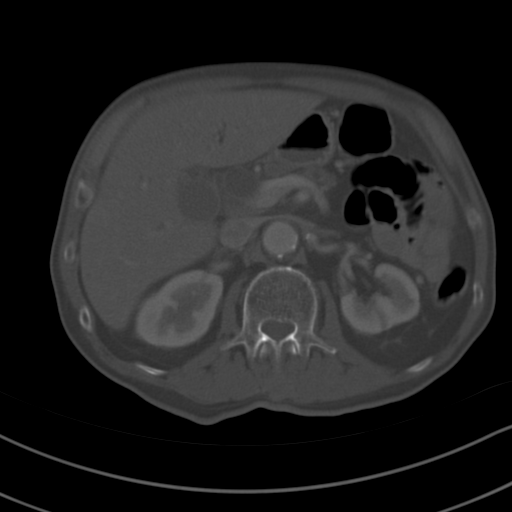
[im 75/89  soft-tissue]
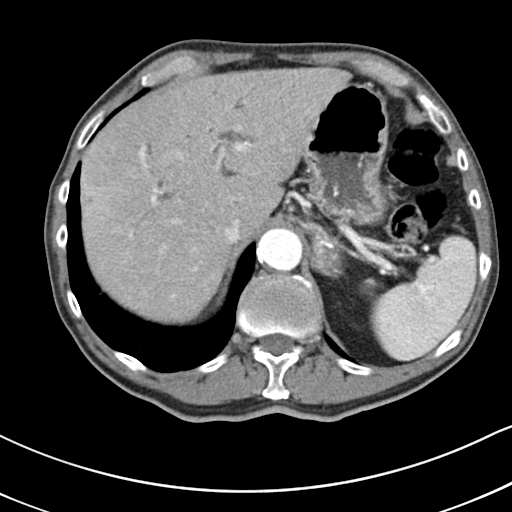
[im 84/89  soft-tissue]
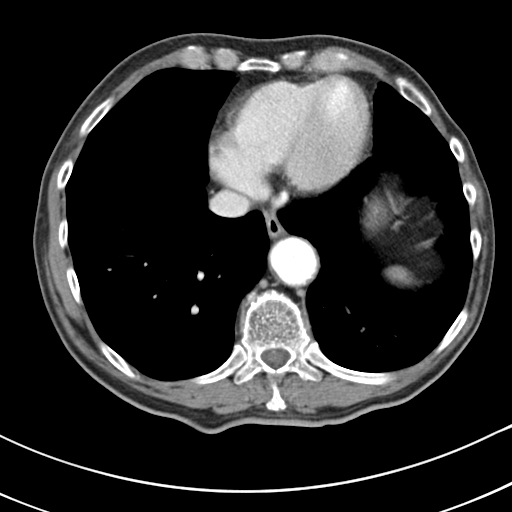

[Series 5: coronal a/|p · coronal · 0.58mm/px · 3 of 83 slices shown]
[im 28/83  soft-tissue]
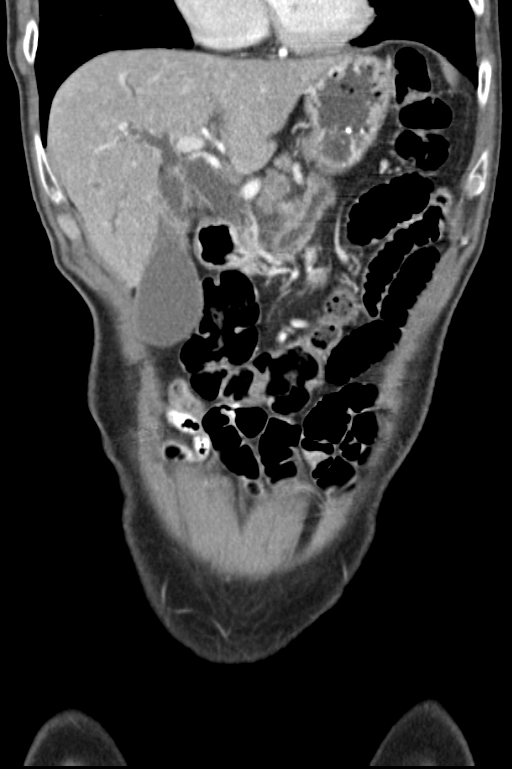
[im 37/83  soft-tissue]
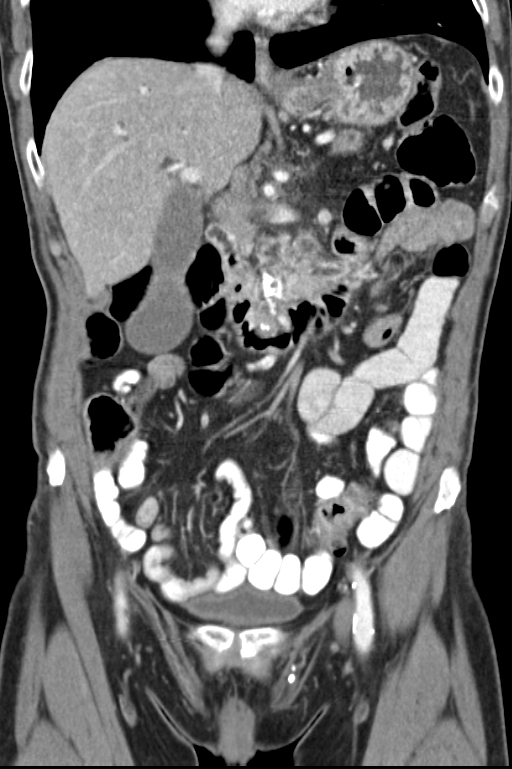
[im 46/83  soft-tissue]
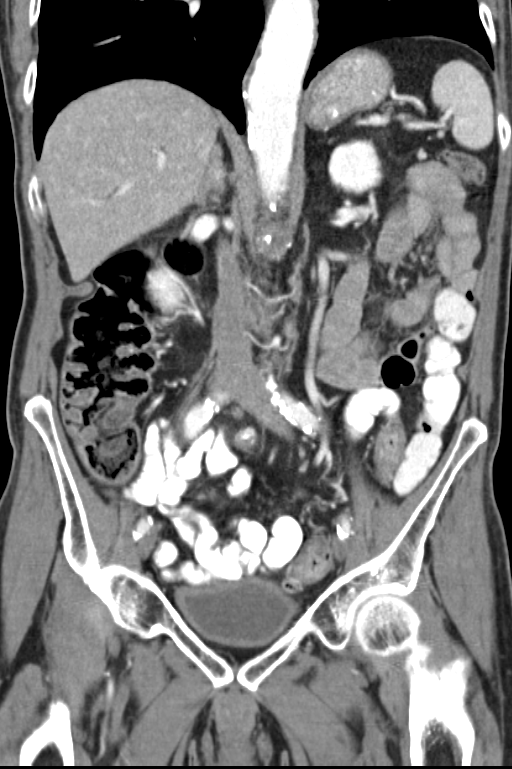

[14 of 46 positions shown; findings below may reference images not displayed]

FINDINGS: There is emphysematous changes of the lung bases. No intra-abdominal
free air or free fluid.

Multiple small hepatic hypodense lesions measuring up to 9 mm in the
right lobe of the liver are not well characterized. There is mild
intrahepatic biliary ductal dilatation. There is dilatation of the
common hepatic and common bile duct. The common bile duct measures
up to 2 cm in diameter. The gallbladder is distended. No calcified
gallstone identified. There is a 1.9 x 2.5 cm ill-defined
hypoenhancing mass in the head of the pancreas. The 1.2 x 1.4 cm
hypodense/hypo enhancing nodular density noted more posterior to the
main mass in the uncinate process of pancreas which may represent
extension of the pancreatic mass or a satellite lesion. There is
atrophy of the body and tail of the pancreas with dilatation of the
main pancreatic duct. The pancreatic mass appears to abut the
central SMV with moderate compression of the vessel. The spleen,
adrenal glands appear unremarkable.

There is mild atrophy of the left kidney with renal cortical
irregularity, likely related to old infarcts. Multiple small left
renal hypodense lesions are not well characterized but possibly
represent cysts. A 1.8 cm exophytic hypodense lesion from the
posterior lateral cortex of the left kidney also likely represents a
cyst. Ultrasound may provide better evaluation of the kidneys. There
is no hydronephrosis on either side. The visualized ureters and
urinary bladder appear unremarkable. The prostate and seminal
vesicles are grossly unremarkable.

There is extensive sigmoid diverticulosis with muscular hypertrophy.
No active inflammatory changes. There is no evidence of bowel
obstruction or inflammation. Normal appendix.

Advanced aortoiliac atherosclerotic disease. The aorta is ectatic
and measures up to 2.5 cm in diameter. The origins of the celiac
axis, SMA, IMA as well as the origins of the renal arteries appear
patent. No portal venous gas identified. There is no adenopathy. The
abdominal wall soft tissues appear unremarkable. There is osteopenia
with degenerative changes of the spine. No acute fracture.
IMPRESSION: Hypoenhancing mass in the head/uncinate process of the pancreas most
compatible with malignancy, likely pancreatic adenocarcinoma.
Further evaluation with MRI without and with contrast or EUS
recommended.

Diffuse biliary ductal dilatation secondary to obstruction of the
central CBD at the head of the pancreas.

Multiple small hepatic hypodense lesions, incompletely
characterized. MRI without and with contrast is recommended for
further characterization.

Sigmoid diverticulosis. No evidence of bowel obstruction or
inflammation.

## 2017-06-14 IMAGING — RF DG ERCP WO/W SPHINCTEROTOMY
1 series · 5 of 5 positions shown · non-contrast
Comparison: CT 09/25/2015

CLINICAL DATA: Obstructive jaundice. Pancreatic lesion on recent
CT.

EXAM:
ERCP
TECHNIQUE: Multiple spot images obtained with the fluoroscopic device and
submitted for interpretation post-procedure.
FLUOROSCOPY TIME:  2 minutes and 36 seconds.  23.4 mGy

[Series 1: run · 5 of 5 slices shown]
[im 1/5]
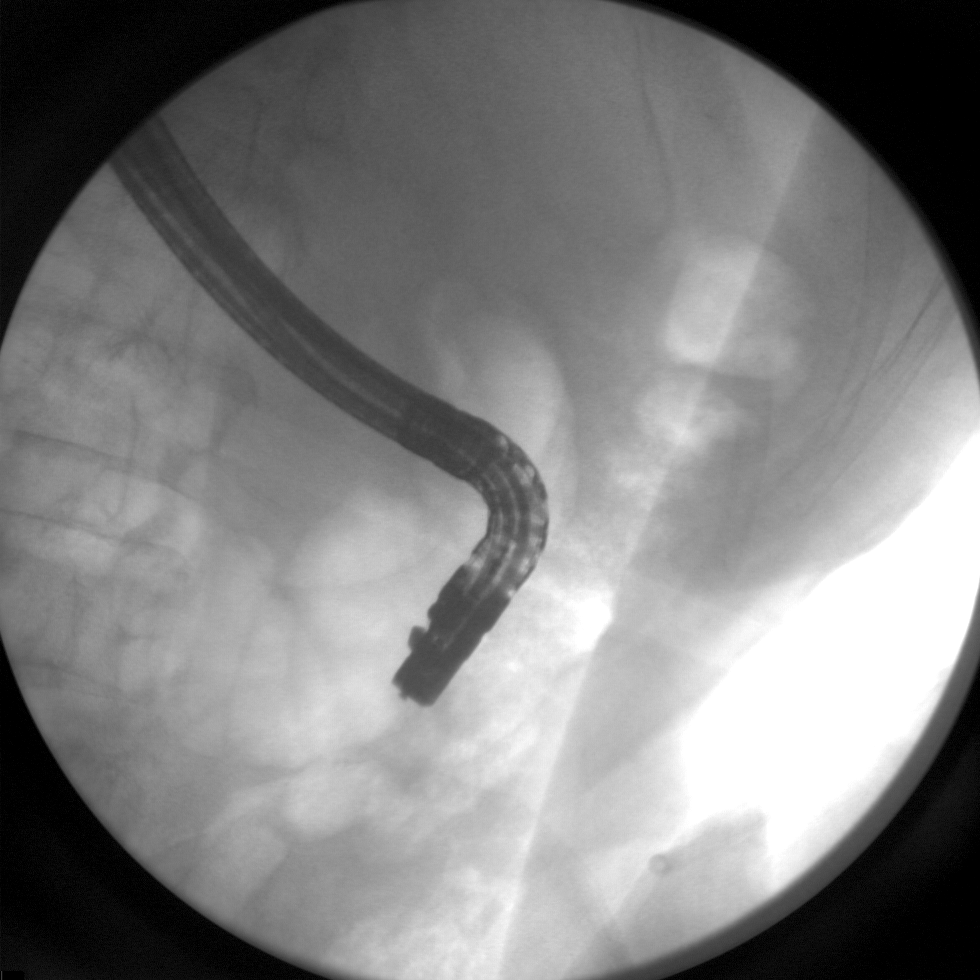
[im 2/5]
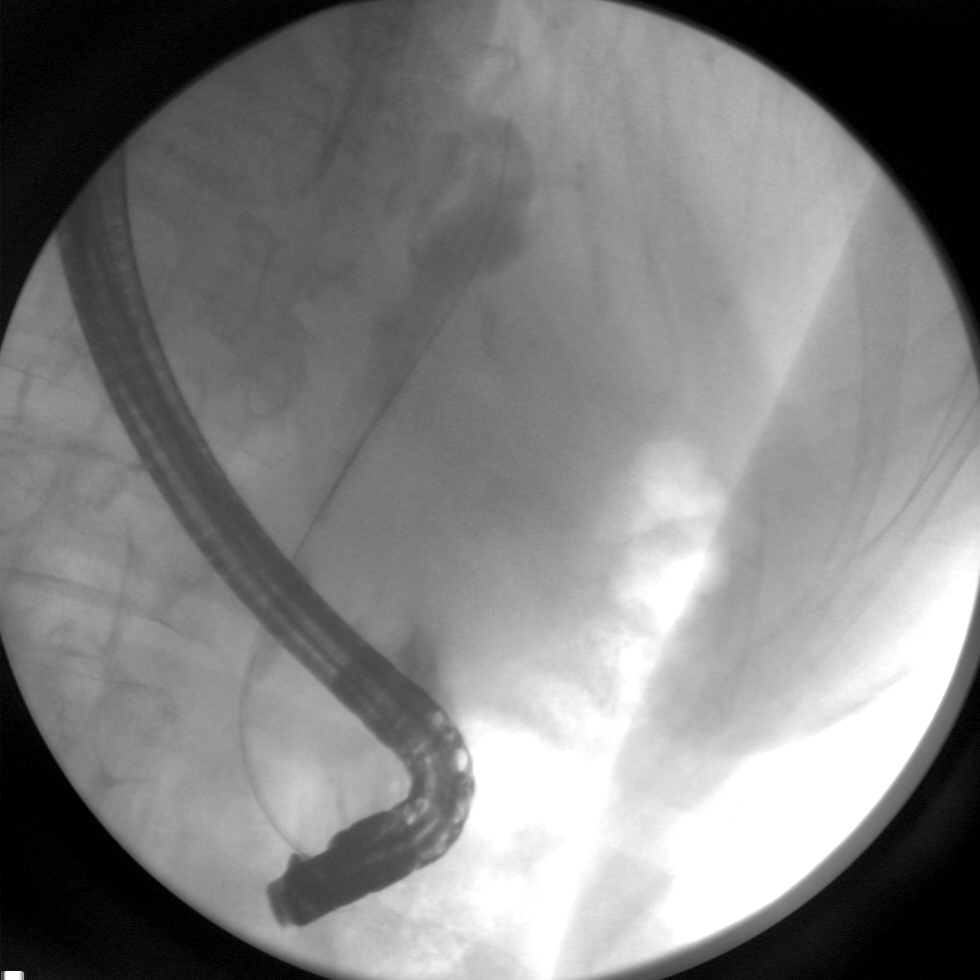
[im 3/5]
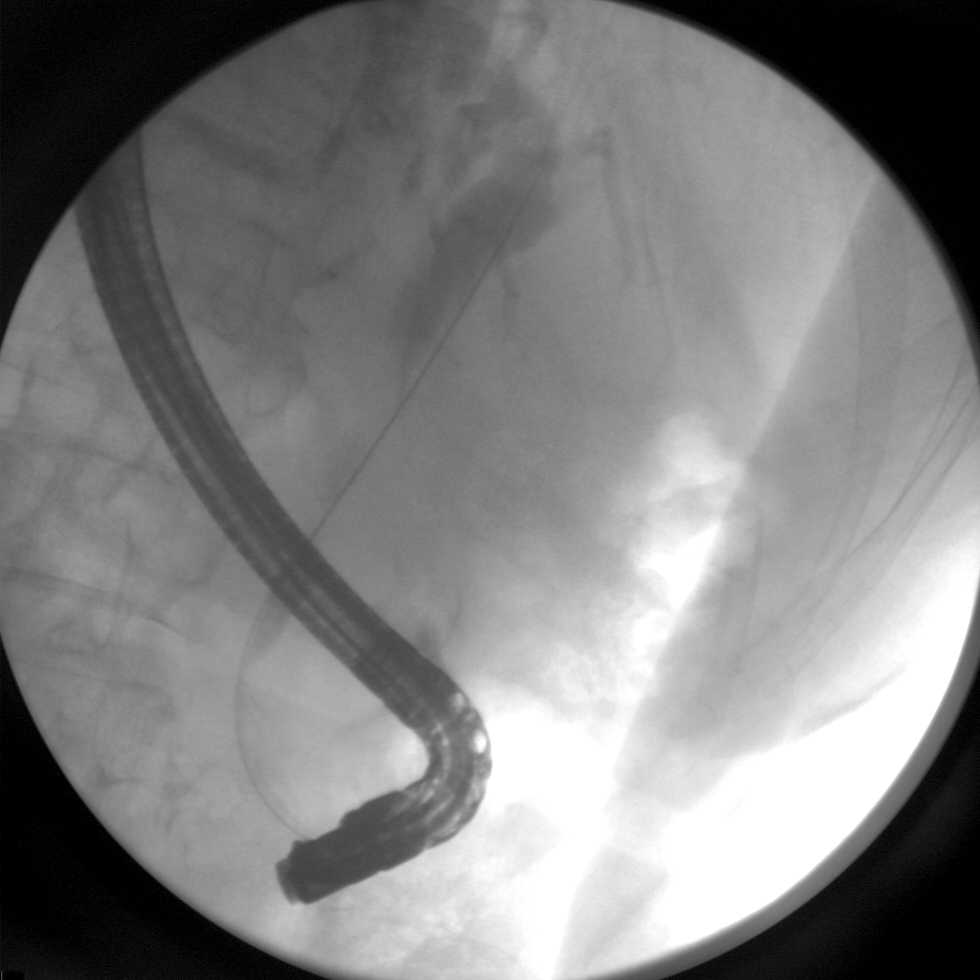
[im 4/5]
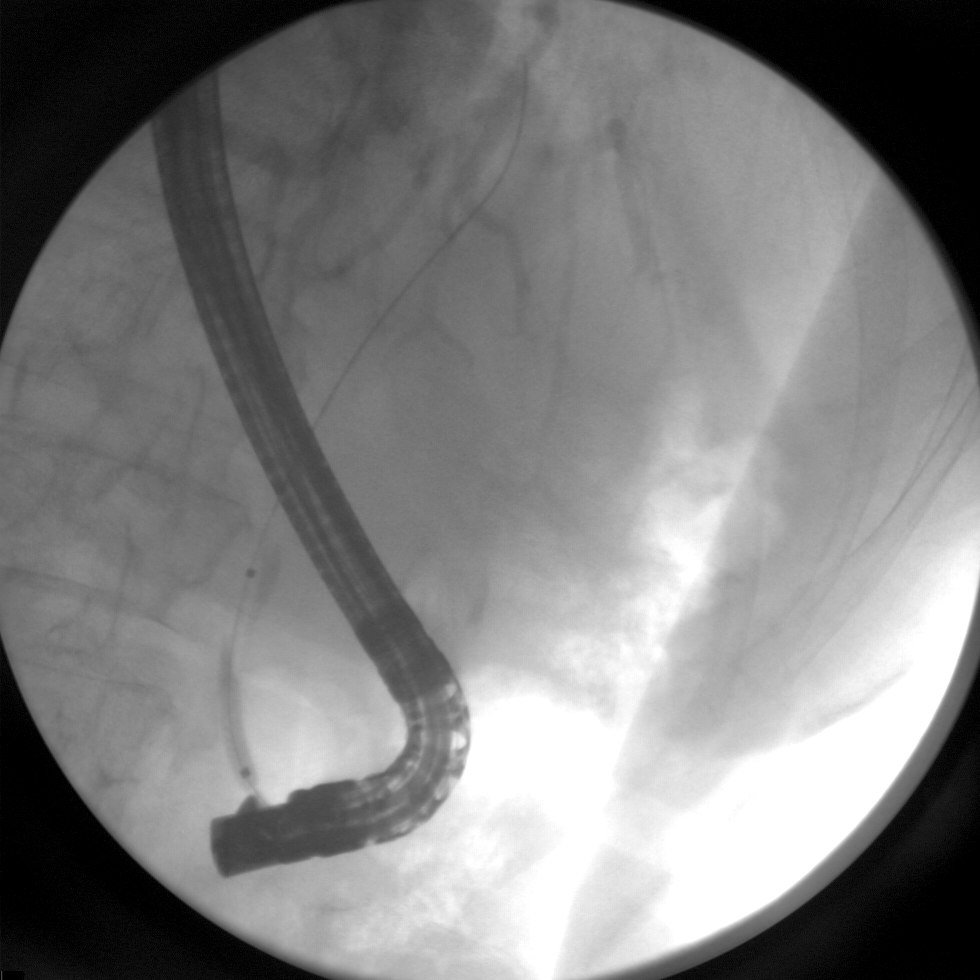
[im 5/5]
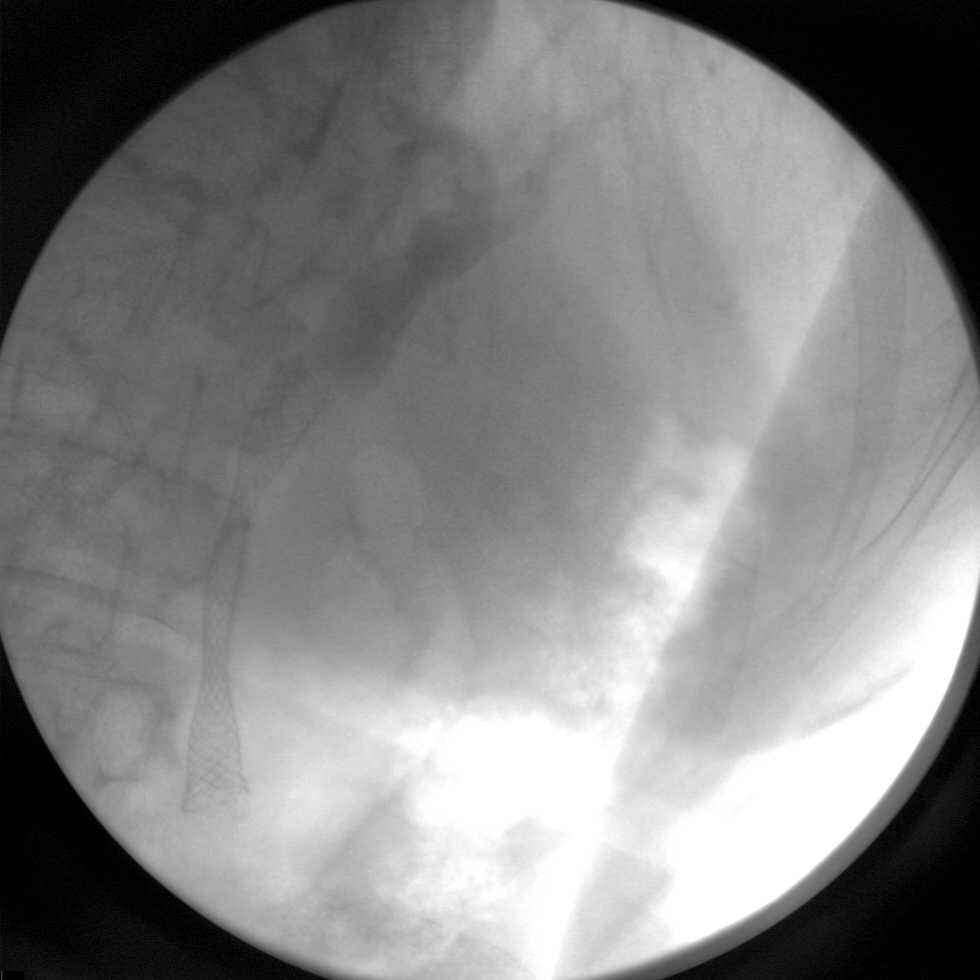

[5 of 5 positions shown; findings below may reference images not displayed]

FINDINGS: Biliary system was cannulated and a wire was advanced into the
intrahepatic ducts. Contrast injection confirmed dilatation of the
central intrahepatic ducts. Balloon was inflated in the distal
common bile duct and a metallic stent was placed in the common bile
duct. Mild narrowing of the common bile duct stent.
IMPRESSION: Biliary dilatation.  Placement of metallic biliary stent.

These images were submitted for radiologic interpretation only.
Please see the procedural report for the amount of contrast and the
fluoroscopy time utilized.
